# Patient Record
Sex: Male | Born: 1960
Health system: Southern US, Community
[De-identification: ages and names within clinical notes are randomized; demographics above are authoritative.]

## PROBLEM LIST (undated history)

## (undated) DIAGNOSIS — K219 Gastro-esophageal reflux disease without esophagitis: Secondary | ICD-10-CM

## (undated) DIAGNOSIS — E785 Hyperlipidemia, unspecified: Secondary | ICD-10-CM

## (undated) HISTORY — DX: Hyperlipidemia, unspecified: E78.5

## (undated) HISTORY — DX: Gastro-esophageal reflux disease without esophagitis: K21.9

---

## 1995-03-30 ENCOUNTER — Encounter: Payer: Self-pay | Admitting: Internal Medicine

## 2004-11-22 ENCOUNTER — Ambulatory Visit: Payer: Self-pay | Admitting: Internal Medicine

## 2004-12-06 ENCOUNTER — Ambulatory Visit: Payer: Self-pay | Admitting: Internal Medicine

## 2005-01-26 ENCOUNTER — Ambulatory Visit: Payer: Self-pay | Admitting: Internal Medicine

## 2005-02-02 ENCOUNTER — Ambulatory Visit: Payer: Self-pay | Admitting: Internal Medicine

## 2005-02-03 ENCOUNTER — Ambulatory Visit: Payer: Self-pay | Admitting: Gastroenterology

## 2005-03-09 ENCOUNTER — Ambulatory Visit: Payer: Self-pay | Admitting: Internal Medicine

## 2005-04-26 ENCOUNTER — Ambulatory Visit: Payer: Self-pay | Admitting: Internal Medicine

## 2005-08-26 ENCOUNTER — Encounter (INDEPENDENT_AMBULATORY_CARE_PROVIDER_SITE_OTHER): Payer: Self-pay | Admitting: *Deleted

## 2005-08-26 ENCOUNTER — Ambulatory Visit (HOSPITAL_BASED_OUTPATIENT_CLINIC_OR_DEPARTMENT_OTHER): Admission: RE | Admit: 2005-08-26 | Discharge: 2005-08-26 | Payer: Self-pay | Admitting: General Surgery

## 2005-12-22 ENCOUNTER — Ambulatory Visit: Payer: Self-pay | Admitting: Licensed Clinical Social Worker

## 2006-05-11 ENCOUNTER — Ambulatory Visit: Payer: Self-pay | Admitting: Internal Medicine

## 2006-09-25 ENCOUNTER — Ambulatory Visit: Payer: Self-pay | Admitting: Internal Medicine

## 2006-10-03 ENCOUNTER — Ambulatory Visit: Payer: Self-pay | Admitting: Internal Medicine

## 2006-11-07 ENCOUNTER — Ambulatory Visit: Payer: Self-pay | Admitting: Gastroenterology

## 2007-03-14 ENCOUNTER — Ambulatory Visit: Payer: Self-pay | Admitting: Internal Medicine

## 2007-03-14 LAB — CONVERTED CEMR LAB
ALT: 35 units/L (ref 0–40)
AST: 27 units/L (ref 0–37)
Albumin: 4.2 g/dL (ref 3.5–5.2)
Alkaline Phosphatase: 89 units/L (ref 39–117)
Bilirubin, Direct: 0.1 mg/dL (ref 0.0–0.3)
Cholesterol: 269 mg/dL (ref 0–200)
Direct LDL: 209.4 mg/dL
HDL: 43.1 mg/dL (ref >39.0)
Total Bilirubin: 0.8 mg/dL (ref 0.3–1.2)
Total CHOL/HDL Ratio: 6.2
Total Protein: 6.8 g/dL (ref 6.0–8.3)
Triglycerides: 112 mg/dL (ref 0–149)
VLDL: 22 mg/dL (ref 0–40)

## 2007-03-21 ENCOUNTER — Ambulatory Visit: Payer: Self-pay | Admitting: Internal Medicine

## 2007-07-06 ENCOUNTER — Ambulatory Visit: Payer: Self-pay | Admitting: Internal Medicine

## 2007-07-06 ENCOUNTER — Telehealth (INDEPENDENT_AMBULATORY_CARE_PROVIDER_SITE_OTHER): Payer: Self-pay | Admitting: *Deleted

## 2007-07-06 LAB — CONVERTED CEMR LAB
Cholesterol: 190 mg/dL (ref 0–200)
HDL: 33.6 mg/dL — ABNORMAL LOW (ref >39.0)
LDL Cholesterol: 134 mg/dL — ABNORMAL HIGH (ref 0–99)
Total CHOL/HDL Ratio: 5.7
Triglycerides: 112 mg/dL (ref 0–149)
VLDL: 22 mg/dL (ref 0–40)

## 2007-07-11 ENCOUNTER — Telehealth: Payer: Self-pay | Admitting: Internal Medicine

## 2007-07-11 ENCOUNTER — Telehealth (INDEPENDENT_AMBULATORY_CARE_PROVIDER_SITE_OTHER): Payer: Self-pay | Admitting: *Deleted

## 2007-07-24 DIAGNOSIS — E785 Hyperlipidemia, unspecified: Secondary | ICD-10-CM | POA: Insufficient documentation

## 2008-06-23 ENCOUNTER — Telehealth: Payer: Self-pay | Admitting: Internal Medicine

## 2008-10-17 ENCOUNTER — Telehealth: Payer: Self-pay | Admitting: Internal Medicine

## 2008-11-07 ENCOUNTER — Telehealth: Payer: Self-pay | Admitting: Internal Medicine

## 2008-11-11 ENCOUNTER — Telehealth: Payer: Self-pay | Admitting: Internal Medicine

## 2008-11-21 ENCOUNTER — Ambulatory Visit: Payer: Self-pay | Admitting: Internal Medicine

## 2008-11-21 LAB — CONVERTED CEMR LAB
ALT: 28 units/L (ref 0–53)
AST: 21 units/L (ref 0–37)
Albumin: 4.4 g/dL (ref 3.5–5.2)
Alkaline Phosphatase: 74 units/L (ref 39–117)
Bilirubin, Direct: 0.1 mg/dL (ref 0.0–0.3)
Cholesterol: 128 mg/dL (ref 0–200)
HDL: 31.2 mg/dL — ABNORMAL LOW (ref >39.0)
LDL Cholesterol: 84 mg/dL (ref 0–99)
Total Bilirubin: 0.8 mg/dL (ref 0.3–1.2)
Total CHOL/HDL Ratio: 4.1
Total Protein: 7.2 g/dL (ref 6.0–8.3)
Triglycerides: 66 mg/dL (ref 0–149)
VLDL: 13 mg/dL (ref 0–40)

## 2008-11-27 ENCOUNTER — Ambulatory Visit: Payer: Self-pay | Admitting: Internal Medicine

## 2008-11-27 DIAGNOSIS — K219 Gastro-esophageal reflux disease without esophagitis: Secondary | ICD-10-CM | POA: Insufficient documentation

## 2008-12-29 ENCOUNTER — Ambulatory Visit: Payer: Self-pay | Admitting: Internal Medicine

## 2009-02-13 ENCOUNTER — Encounter: Payer: Self-pay | Admitting: Internal Medicine

## 2009-03-11 ENCOUNTER — Ambulatory Visit: Payer: Self-pay | Admitting: Internal Medicine

## 2009-03-11 LAB — CONVERTED CEMR LAB
ALT: 20 units/L (ref 0–53)
AST: 21 units/L (ref 0–37)
Albumin: 4.3 g/dL (ref 3.5–5.2)
Alkaline Phosphatase: 62 units/L (ref 39–117)
Bilirubin, Direct: 0 mg/dL (ref 0.0–0.3)
Cholesterol: 178 mg/dL (ref 0–200)
HDL: 43.8 mg/dL (ref >39.00)
LDL Cholesterol: 111 mg/dL — ABNORMAL HIGH (ref 0–99)
Total Bilirubin: 0.8 mg/dL (ref 0.3–1.2)
Total CHOL/HDL Ratio: 4
Total Protein: 7.4 g/dL (ref 6.0–8.3)
Triglycerides: 114 mg/dL (ref 0.0–149.0)
VLDL: 22.8 mg/dL (ref 0.0–40.0)

## 2009-03-17 ENCOUNTER — Ambulatory Visit: Payer: Self-pay | Admitting: Internal Medicine

## 2009-09-21 ENCOUNTER — Telehealth: Payer: Self-pay | Admitting: Internal Medicine

## 2009-09-30 ENCOUNTER — Ambulatory Visit: Payer: Self-pay | Admitting: Internal Medicine

## 2009-09-30 LAB — CONVERTED CEMR LAB
Cholesterol: 171 mg/dL (ref 0–200)
HDL: 37.6 mg/dL — ABNORMAL LOW (ref >39.00)
LDL Cholesterol: 114 mg/dL — ABNORMAL HIGH (ref 0–99)
Total CHOL/HDL Ratio: 5
Triglycerides: 97 mg/dL (ref 0.0–149.0)
VLDL: 19.4 mg/dL (ref 0.0–40.0)

## 2009-10-06 ENCOUNTER — Telehealth: Payer: Self-pay | Admitting: Internal Medicine

## 2009-11-06 ENCOUNTER — Encounter (INDEPENDENT_AMBULATORY_CARE_PROVIDER_SITE_OTHER): Payer: Self-pay | Admitting: *Deleted

## 2009-11-09 ENCOUNTER — Telehealth: Payer: Self-pay | Admitting: Internal Medicine

## 2010-03-12 ENCOUNTER — Telehealth: Payer: Self-pay | Admitting: Internal Medicine

## 2010-03-15 ENCOUNTER — Ambulatory Visit: Payer: Self-pay | Admitting: Family Medicine

## 2010-03-16 ENCOUNTER — Telehealth: Payer: Self-pay | Admitting: Internal Medicine

## 2010-03-18 ENCOUNTER — Encounter: Payer: Self-pay | Admitting: Family Medicine

## 2010-03-18 LAB — HM COLONOSCOPY

## 2010-04-13 ENCOUNTER — Ambulatory Visit: Payer: Self-pay | Admitting: Internal Medicine

## 2010-04-13 LAB — CONVERTED CEMR LAB
ALT: 24 units/L (ref 0–53)
AST: 22 units/L (ref 0–37)
Albumin: 4.5 g/dL (ref 3.5–5.2)
Alkaline Phosphatase: 70 units/L (ref 39–117)
BUN: 15 mg/dL (ref 6–23)
Basophils Absolute: 0.1 10*3/uL (ref 0.0–0.1)
Basophils Relative: 1 % (ref 0.0–3.0)
Bilirubin Urine: NEGATIVE
Bilirubin, Direct: 0.1 mg/dL (ref 0.0–0.3)
CO2: 29 meq/L (ref 19–32)
Calcium: 9.9 mg/dL (ref 8.4–10.5)
Chloride: 101 meq/L (ref 96–112)
Cholesterol: 216 mg/dL — ABNORMAL HIGH (ref 0–200)
Creatinine, Ser: 1.1 mg/dL (ref 0.4–1.5)
Direct LDL: 146.1 mg/dL
Eosinophils Absolute: 0.3 10*3/uL (ref 0.0–0.7)
Eosinophils Relative: 5.5 % — ABNORMAL HIGH (ref 0.0–5.0)
GFR calc non Af Amer: 75.21 mL/min (ref >60)
Glucose, Bld: 96 mg/dL (ref 70–99)
HCT: 43.1 % (ref 39.0–52.0)
HDL: 47.2 mg/dL (ref >39.00)
Hemoglobin: 14.9 g/dL (ref 13.0–17.0)
Ketones, ur: NEGATIVE mg/dL
Leukocytes, UA: NEGATIVE
Lymphocytes Relative: 22.2 % (ref 12.0–46.0)
Lymphs Abs: 1.3 10*3/uL (ref 0.7–4.0)
MCHC: 34.5 g/dL (ref 30.0–36.0)
MCV: 95.3 fL (ref 78.0–100.0)
Monocytes Absolute: 0.7 10*3/uL (ref 0.1–1.0)
Monocytes Relative: 11.1 % (ref 3.0–12.0)
Neutro Abs: 3.7 10*3/uL (ref 1.4–7.7)
Neutrophils Relative %: 60.2 % (ref 43.0–77.0)
Nitrite: NEGATIVE
PSA: 0.31 ng/mL (ref 0.10–4.00)
Platelets: 299 10*3/uL (ref 150.0–400.0)
Potassium: 4.5 meq/L (ref 3.5–5.1)
RBC: 4.52 M/uL (ref 4.22–5.81)
RDW: 13.4 % (ref 11.5–14.6)
Sodium: 139 meq/L (ref 135–145)
Specific Gravity, Urine: 1.01 (ref 1.000–1.030)
TSH: 1.29 microintl units/mL (ref 0.35–5.50)
Total Bilirubin: 0.9 mg/dL (ref 0.3–1.2)
Total CHOL/HDL Ratio: 5
Total Protein, Urine: NEGATIVE mg/dL
Total Protein: 7.2 g/dL (ref 6.0–8.3)
Triglycerides: 114 mg/dL (ref 0.0–149.0)
Urine Glucose: NEGATIVE mg/dL
Urobilinogen, UA: 0.2 (ref 0.0–1.0)
VLDL: 22.8 mg/dL (ref 0.0–40.0)
WBC: 6.1 10*3/uL (ref 4.5–10.5)
pH: 7 (ref 5.0–8.0)

## 2010-04-20 ENCOUNTER — Telehealth: Payer: Self-pay | Admitting: Internal Medicine

## 2010-05-11 ENCOUNTER — Ambulatory Visit: Payer: Self-pay | Admitting: Internal Medicine

## 2010-10-27 ENCOUNTER — Telehealth: Payer: Self-pay | Admitting: Internal Medicine

## 2011-01-18 NOTE — Assessment & Plan Note (Signed)
Summary: ear discomfort//ccm   Vital Signs:  Patient profile:   50 year old male Weight:      189 pounds BMI:     26.83 Temp:     98.1 degrees F oral BP sitting:   118 / 84  (left arm) Cuff size:   regular  Vitals Entered By: Raechel Ache, RN (March 17, 2009 8:47 AM)  CC:  C/o L ear discomfort.Marland Kitchen  History of Present Illness: 50 year old patient who presents with a two day history of fullness and diminished hearing in the left ear.  There have been no URI symptoms or ear discomfort.  He was somewhat concerned because he will be flying internationally in about one week.  No fever. He has treated gastroesophageal reflux disease, and dyslipidemia, which have been stable  Dyspepsia History:      There is a prior history of GERD.     Allergies: No Known Drug Allergies  Past History:  Past Medical History:    Hyperlipidemia    GERD     (11/27/2008)  Physical Exam  General:  Well-developed,well-nourished,in no acute distress; alert,appropriate and cooperative throughout examination Head:  Normocephalic and atraumatic without obvious abnormalities. No apparent alopecia or balding. Ears:  R ear normal and L ear normal.  Weber did not lateralize   Impression & Recommendations:  Problem # 1:  GERD (ICD-530.81)  His updated medication list for this problem includes:    Nexium 40 Mg Cpdr (Esomeprazole magnesium) ..... One by mouth daily  Problem # 2:  OTITIS MEDIA, SEROUS, ACUTE, ALLERGIC (ICD-381.04)  Complete Medication List: 1)  Simvastatin 20 Mg Tabs (Simvastatin) .Marland Kitchen.. 1 by mouth at bedtime 2)  Nexium 40 Mg Cpdr (Esomeprazole magnesium) .... One by mouth daily  Patient Instructions: 1)  Avoid foods high in acid (tomatoes, citrus juices, spicy foods). Avoid eating within two hours of lying down or before exercising. Do not over eat; try smaller more frequent meals. Elevate head of bed twelve inches when sleeping. 2)  Claritin-D 24 hour-one every a.m. 3)  Nasacort AQ use  daily

## 2011-01-18 NOTE — Progress Notes (Signed)
Summary: pt has question  Phone Note Call from Patient Call back at [phone] press 0 to have pt paged   Caller: Patient Summary of Call: Pt called to get result from cholesterol screening. Please call asap today.  Initial call taken by: Lucy Antigua,  October 06, 2009 9:25 AM  Follow-up for Phone Call        Pt given results over the phone.  He is concerned that not as good as before and simvastatin was changed from 40mg  to 20mg  11/27/08.  Should he bump it back up or ok as is? Follow-up by: Gladis Riffle, RN,  October 06, 2009 2:28 PM  Additional Follow-up for Phone Call Additional follow up Details #1::        lipid panel is perfectly adequate---i would stay on same dose Additional Follow-up by: Birdie Sons MD,  October 06, 2009 4:20 PM    Additional Follow-up for Phone Call Additional follow up Details #2::    Patient notified.  Follow-up by: Gladis Riffle, RN,  October 07, 2009 2:20 PM

## 2011-01-18 NOTE — Letter (Signed)
Summary: Physical Examination  Physical Examination   Imported By: Maryln Gottron 03/17/2009 15:16:14  _____________________________________________________________________  External Attachment:    Type:   Image     Comment:   External Document

## 2011-01-18 NOTE — Progress Notes (Signed)
Summary: med request  Phone Note Call from Patient Call back at cell [phone]   Caller: Patient--live Call For: Kyle Palmer Summary of Call: Pt seen by Dr Caryl Never yesterday for throat issues.  States needs something to calm his nerves as he has worked himself up to point of losing weight and not sleeping.  Cannot have endoscopy until Thursday.  [facility] [address] Initial call taken by: Gladis Riffle, RN,  March 16, 2010 8:21 AM  Follow-up for Phone Call        alprazolam 0.25 mg by mouth once daily as needed #5/0 refills Follow-up by: Kyle Palmer,  March 16, 2010 9:51 AM  Additional Follow-up for Phone Call Additional follow up Details #1::        see Rx.  Patient notified. Pt advised only as needed and only #5 tabs. Additional Follow-up by: Gladis Riffle, RN,  March 16, 2010 11:02 AM    New/Updated Medications: ALPRAZOLAM 0.25 MG TABS (ALPRAZOLAM) Take 1 tablet by mouth once a day as needed Prescriptions: ALPRAZOLAM 0.25 MG TABS (ALPRAZOLAM) Take 1 tablet by mouth once a day as needed  #5 x 0   Entered by:   Gladis Riffle, RN   Authorized by:   Kyle Palmer   Signed by:   Gladis Riffle, RN on 03/16/2010   Method used:   Telephoned to ...       [id number]       [address]       [place], [place]  [address]       Ph: [phone] or [phone]       Fax: [phone]   RxID:   [id number]

## 2011-01-18 NOTE — Progress Notes (Signed)
Summary: pt said Dr. Penni Bombard is sending some info to Dr. Cato Mulligan  Phone Note Call from Patient Call back at Work Phone [phone] Call back at Press 0 to have pt paged   Caller: Patient Summary of Call: Pt said that Dr. Penni Bombard with [facility] is going to be send Dr. Cato Mulligan a copy of pts xray, showing calcium deposits built up on patients aorta. Pt just wants to make Dr. Cato Mulligan aware.  Initial call taken by: Lucy Antigua,  November 09, 2009 8:22 AM  Follow-up for Phone Call        get information Follow-up by: Birdie Sons MD,  November 09, 2009 12:28 PM  Additional Follow-up for Phone Call Additional follow up Details #1::        Dr Penni Bombard office closed for holiday weekend.Gladis Riffle, RN  November 11, 2009 4:15 PM   [facility] is [phone]  Report is  in receipt.  will give to dr swords. Additional Follow-up by: Gladis Riffle, RN,  November 17, 2009 2:30 PM

## 2011-01-18 NOTE — Progress Notes (Signed)
Summary: Physical blood work review  Phone Note Call from Patient Call back at Work Phone [phone]   Reason for Call: Talk to Nurse Summary of Call: Patient wants to review blood work.  Was scheduled for a physical today but was bumped from schedule. Please call and go over blood work - physical rescheduled for 5/24.  Call at [phone] (press 0 and have him paged).  Thank you Initial call taken by: Everrett Coombe,  Apr 20, 2010 8:18 AM  Follow-up for Phone Call        explained blood work on personal voice mail.Joya Gaskins MD would review at cpx and he could have a copy then. Follow-up by: Gladis Riffle, RN,  Apr 20, 2010 9:02 AM

## 2011-01-18 NOTE — Progress Notes (Signed)
Summary: 2 Nexium a day???  Phone Note Call from Patient   Caller: Patient Call For: Dr Cato Mulligan Summary of Call: Pt is leaving on vacation tomorrow, and wants to know if he can take 2 Nexium 40 mg. instead of one.  He does not eat as healthy while on vacation. [phone] [facility]) Initial call taken by: Lynann Beaver CMA,  June 23, 2008 1:18 PM  Follow-up for Phone Call        it is not correct to double medicaiton for diet noncompliance. EAT correctly even on vacation!   The medication cannot be refilled early if he doubles it! Follow-up by: Stacie Glaze MD,  June 23, 2008 3:05 PM  Additional Follow-up for Phone Call Additional follow up Details #1::        Message left for pt with Dr. Lovell Sheehan' recommendations. Additional Follow-up by: Lynann Beaver CMA,  June 23, 2008 3:15 PM

## 2011-01-18 NOTE — Progress Notes (Signed)
Summary: REQ FOR INFO / RETURN CALL  Phone Note Call from Patient   Caller: Patient  [phone] Reason for Call: Talk to Nurse Summary of Call: Pt called to speak with RN (w/ a pt).Marland KitchenMarland KitchenPt adv that he has a question about lab results, currently applying for life insurance policy - needs info.... Pt can be reached at [phone]., press 0 and have pt paged..... If unable to make contact at this work # then pt can be reached at [phone].  Initial call taken by: Debbra Riding,  March 12, 2010 10:07 AM  Follow-up for Phone Call        spoke with patient  Follow-up by: Kern Reap CMA Duncan Dull),  March 12, 2010 2:39 PM

## 2011-01-18 NOTE — Progress Notes (Signed)
Summary:  Simvastatin refill  Phone Note Call from Patient Call back at [phone] (cell)   Caller: Patient Call For: Birdie Sons MD Summary of Call: Pt Simvastatin was denied.  Pt is out, requesting enough to make it to his OV on 12/4 with Dr Cato Mulligan, that would be 10 pills please. Call pt at [phone] if this is a problem only, otherwise he will pick-up at his pharmacy. Initial call taken by: Sid Falcon LPN,  November 11, 2008 8:54 AM  Follow-up for Phone Call        pt made appt for 11/27/08 for rov and cpx for 1/10.  Rx done electronically  and pt aware. Follow-up by: Gladis Riffle, RN,  November 11, 2008 10:12 AM    New/Updated Medications: SIMVASTATIN 40 MG TABS (SIMVASTATIN) 1 once daily   Prescriptions: SIMVASTATIN 40 MG TABS (SIMVASTATIN) 1 once daily  #30 x 0   Entered by:   Gladis Riffle, RN   Authorized by:   Birdie Sons MD   Signed by:   Gladis Riffle, RN on 11/11/2008   Method used:   [facility]        [facility] [address]  [id number]       [address]       [place], [place]  [address]       Ph: [phone] or [phone]       Fax: [phone]   RxID:   [id number]

## 2011-01-18 NOTE — Progress Notes (Signed)
Summary: refill-simvastatin  Phone Note Call from Patient Call back at Home Phone [phone]   Caller: pt live Call For: Swords Summary of Call: Crestor generic 40 mg is out of pills and has not been in for a while.  He has an appt after thanksgiving  [facility] [address]  Initial call taken by: Roselle Locus,  November 07, 2008 3:25 PM  Follow-up for Phone Call        is he on Crestor or simvastatin? Follow-up by: Birdie Sons MD,  November 08, 2008 1:54 PM  Additional Follow-up for Phone Call Additional follow up Details #1::        Simvastin is what he on, and it says in the chart "absolutely" no more refills. Additional Follow-up by: Lynann Beaver CMA,  November 10, 2008 8:07 AM    Additional Follow-up for Phone Call Additional follow up Details #2::    appt he has is 12/4 for lab.  He needs appt with MD one week later so Rx can be filled.  Last seen 03/21/07.Left message on machine. Pt to call back to explain this and make appt for 12/11 Follow-up by: Gladis Riffle, RN,  November 10, 2008 1:03 PM  Additional Follow-up for Phone Call Additional follow up Details #3:: Details for Additional Follow-up Action Taken: Appt made for rov 11/27/08.  also made cpx appt for jan, 2010.  Rx will be called in. Additional Follow-up by: Gladis Riffle, RN,  November 11, 2008 9:53 AM

## 2011-01-18 NOTE — Progress Notes (Signed)
Summary: Pt called re: Nexium. Pt was in for ov in May 2011  Phone Note Call from Patient Call back at Work Phone [phone] Call back at press 0 and have pt paged or call on cell [phone]   Caller: Patient Summary of Call: Pt called and said that he is irritated that his Nexium can not be called in unless he makes and appt. Pt wants to know why, since he was here in May of 2011. [facility] [address]  Initial call taken by: Lucy Antigua,  October 27, 2010 1:24 PM  Follow-up for Phone Call        Phone Call Completed, Rx Called In Follow-up by: Alfred Levins, CMA,  October 27, 2010 1:45 PM    Prescriptions: NEXIUM 40 MG  CPDR (ESOMEPRAZOLE MAGNESIUM) one by mouth twice daily  #180 x 0   Entered by:   Alfred Levins, CMA   Authorized by:   Birdie Sons MD   Signed by:   Alfred Levins, CMA on 10/27/2010   Method used:   Electronically to        [facility]       [address]       [place], [place]  [address]       Ph: [phone] or [phone]       Fax: [phone]   RxID:   [id number] NEXIUM 40 MG  CPDR (ESOMEPRAZOLE MAGNESIUM) one by mouth twice daily  #180 x 0   Entered by:   Alfred Levins, CMA   Authorized by:   Nelwyn Salisbury MD   Signed by:   Alfred Levins, CMA on 10/27/2010   Method used:   Electronically to        [facility]       [address]       [place], [place]  [address]       Ph: [phone] or [phone]       Fax: [phone]   RxID:   [id number]

## 2011-01-18 NOTE — Progress Notes (Signed)
Summary: nexium RF-pull chart to see if phone note complete  Phone Note Call from Patient   Summary of Call: Here to get lab.  States he needs Nexium called in.  RF have expired. [id number] Initial call taken by: Rudy Jew, RN,  July 06, 2007 8:55 AM  Follow-up for Phone Call        ok x1 per Dr Lovell Sheehan.  Pharm notified and pt advised Follow-up by: Rudy Jew, RN,  July 10, 2007 10:24 AM

## 2011-01-18 NOTE — Progress Notes (Signed)
Summary: lab results  Phone Note Call from Patient   Caller: Patient Call For: Dr. Cato Mulligan Reason for Call: Lab or Test Results Details for Reason: wants chol results fromj 07/06/2007 Initial call taken by: Lynann Beaver CMA,  July 11, 2007 12:26 PM  Follow-up for Phone Call        lipids are o.k. hdl is low---most of this is genentic (most likely). Low fat diet, aerobic exercise will likely help Follow-up by: Birdie Sons MD,  July 11, 2007 2:13 PM  Additional Follow-up for Phone Call Additional follow up Details #1::        Kristen was taking 20 mg of Crestor, so he will go back on 40 mg. Additional Follow-up by: Lynann Beaver CMA,  July 11, 2007 2:48 PM    Additional Follow-up for Phone Call Additional follow up Details #2::    ok to resume meds

## 2011-01-18 NOTE — Progress Notes (Signed)
  Medications Added SIMVASTATIN 40 MG TABS (SIMVASTATIN)             New/Updated Medications: SIMVASTATIN 40 MG TABS (SIMVASTATIN)

## 2011-01-18 NOTE — Miscellaneous (Signed)
Summary: Immunization Entry-tdap   Tetanus/Td Immunization History:    Tetanus/Td # 1:  Tdap (11/03/2006)

## 2011-01-18 NOTE — Procedures (Signed)
Summary: Colonoscopic snare polypectomy/Dr. Sharrell Ku  Colonoscopic snare polypectomy/Dr. Sharrell Ku   Imported By: Maryln Gottron 04/12/2010 10:14:08  _____________________________________________________________________  External Attachment:    Type:   Image     Comment:   External Document

## 2011-01-18 NOTE — Assessment & Plan Note (Signed)
Summary: THROAT IRRITATION GETTING WORSE//SLM   Vital Signs:  Patient profile:   50 year old male Weight:      181 pounds Temp:     98.2 degrees F oral BP sitting:   130 / 84  (left arm) Cuff size:   regular  Vitals Entered By: Sid Falcon LPN (March 15, 2010 1:43 PM) CC: Throat concerns   History of Present Illness: Several months of mild "throat" irritation.  Occ mild discomfort with swallowing.  No true dysphagia.  Denies any appetite changes.  Hx GERD and takes Nexium regularly.  Pt is concerned that he may have esophageal cancer.  No neck masses.   Ex-smoker (quit age 21).  ETOH about 2 glasses wine/day.  No oral tobacco use.  No chronic hoarseness.  Weight is down slightly from last year of ? significance.  Pos FH colon cancer in pt's mother.  Pt never screened.  No change in stools.  Allergies (verified): No Known Drug Allergies  Past History:  Past Medical History: Last updated: 11/27/2008 Hyperlipidemia GERD  Family History: Last updated: 03/11/2009 Non contributory  Social History: Last updated: 03/11/2009 Former Smoker Married Regular exercise-no  Risk Factors: Exercise: no (03/11/2009)  Risk Factors: Smoking Status: quit (07/24/2007) PMH-FH-SH reviewed for relevance  Review of Systems  The patient denies anorexia, fever, weight gain, hoarseness, chest pain, syncope, dyspnea on exertion, abdominal pain, melena, hematochezia, and severe indigestion/heartburn.    Physical Exam  General:  Well-developed,well-nourished,in no acute distress; alert,appropriate and cooperative throughout examination Mouth:  Oral mucosa and oropharynx without lesions or exudates.  Teeth in good repair. Neck:  No deformities, masses, or tenderness noted. Lungs:  Normal respiratory effort, chest expands symmetrically. Lungs are clear to auscultation, no crackles or wheezes. Heart:  Normal rate and regular rhythm. S1 and S2 normal without gallop, murmur, click, rub or other  extra sounds. Abdomen:  soft and non-tender.   Cervical Nodes:  No lymphadenopathy noted   Impression & Recommendations:  Problem # 1:  NECK PAIN (ICD-723.1) Assessment New pt has extreme anxiety regarding esoph cancer.  Does have some mild weight loss since last year of ?significance.  Rec GI referral esp with pos FH colon cancer and no prior screening.  Pt requests Dr Kinnie Scales.  Problem # 2:  FM HX MALIGNANT NEOPLASM GASTROINTESTINAL TRACT (ICD-V16.0) needs colonoscopy screening. Orders: Gastroenterology Referral (GI)  Complete Medication List: 1)  Simvastatin 20 Mg Tabs (Simvastatin) .Marland Kitchen.. 1 by mouth at bedtime--needs office visit in march 2)  Nexium 40 Mg Cpdr (Esomeprazole magnesium) .... One by mouth daily  Patient Instructions: 1)  We will call you regarding Appt with Dr Kinnie Scales.

## 2011-01-18 NOTE — Progress Notes (Signed)
Summary: RX REFILL  Phone Note Call from Patient Call back at [phone]   Caller: pt live Call For: Swords Reason for Call: Refill Medication Summary of Call: PATIENT NEEDS RX REFILL FOR GENERIC OF ZOCOR 40MG .  HE CALLED IS PHARMACY AND THEY SAID THE dR NEEDED TO OK IT.  [facility] [address]  PATIENT IS OUT OF MED. Initial call taken by: Celine Ahr,  October 17, 2008 11:17 AM    New/Updated Medications: SIMVASTATIN 40 MG TABS (SIMVASTATIN) 1 once daily- ABSOLUTELY NO MORE WITHOUT OV!!!!!!!   Prescriptions: SIMVASTATIN 40 MG TABS (SIMVASTATIN) 1 once daily- ABSOLUTELY NO MORE WITHOUT OV!!!!!!!  #15 x 0   Entered by:   Willy Eddy, LPN   Authorized by:   Birdie Sons MD   Signed by:   Willy Eddy, LPN on 13/07/6577   Method used:   [facility]        [facility] [address]  [facility]       [address]       [place], [place]  [address]       Ph: [phone] or [phone]       Fax: [phone]   RxID:   [id number]

## 2011-01-18 NOTE — Assessment & Plan Note (Signed)
Summary: med refill, review labs/et   Vital Signs:  Patient Profile:   50 Years Old Male Weight:      190 pounds BP sitting:   110 / 72  Vitals Entered By: Lynann Beaver CMA (November 27, 2008 8:50 AM)                 Chief Complaint:  discuss lab results.  History of Present Illness:  Follow-Up Visit: not seen in > 1 year he has been taking meds without dificulty      This is a 50 year old man who presents for Follow-up visit.  The patient denies chest pain, palpitations, dizziness, syncope, low blood sugar symptoms, high blood sugar symptoms, edema, SOB, DOE, PND, and orthopnea.  Since the last visit the patient notes no new problems or concerns.  The patient reports taking meds as prescribed.  When questioned about possible medication side effects, the patient notes none.    Past Medical History: Hyperlipidemia GERD  Past Surgical History:  Social History: Former Smoker  Family History:  no other complaints in a complete ROS     Past Medical History:    Hyperlipidemia    GERD      Physical Exam  General:     Well-developed,well-nourished,in no acute distress; alert,appropriate and cooperative throughout examination Head:     normocephalic and atraumatic.   Eyes:     pupils equal and pupils round.   Ears:     R ear normal and L ear normal.      Impression & Recommendations:  Problem # 1:  HYPERLIPIDEMIA (ICD-272.4) continue meds His updated medication list for this problem includes:    Simvastatin 20 Mg Tabs (Simvastatin) .Marland Kitchen... 1 by mouth at bedtime  Labs Reviewed: Chol: 128 (11/21/2008)   HDL: 31.2 (11/21/2008)   LDL: 84 (11/21/2008)   TG: 66 (11/21/2008) SGOT: 21 (11/21/2008)   SGPT: 28 (11/21/2008)   Complete Medication List: 1)  Simvastatin 20 Mg Tabs (Simvastatin) .Marland Kitchen.. 1 by mouth at bedtime 2)  Nexium 40 Mg Cpdr (Esomeprazole magnesium) .... One by mouth daily   Patient Instructions: 1)  Please schedule a follow-up appointment in 6  months. CPX 2)  cancel January appt 3)  lipids 272.4 4)  liver 995.2   Prescriptions: SIMVASTATIN 20 MG TABS (SIMVASTATIN) 1 by mouth at bedtime  #90 x 3   Entered and Authorized by:   Birdie Sons MD   Signed by:   Birdie Sons MD on 11/27/2008   Method used:   [facility]        [facility] [address]  [facility]       [address]       [place], [place]  [address]       Ph: [phone] or [phone]       Fax: [phone]   RxID:   [id number]  ]

## 2011-01-18 NOTE — Procedures (Signed)
Summary: [facility] with biopsy/Dr. Sharrell Ku  [facility] with biopsy/Dr. Sharrell Ku   Imported By: Maryln Gottron 04/12/2010 10:22:22  _____________________________________________________________________  External Attachment:    Type:   Image     Comment:   External Document

## 2011-01-18 NOTE — Assessment & Plan Note (Signed)
Summary: form fill out/mhf   Vital Signs:  Patient profile:   50 year old male Height:      70.5 inches Weight:      186 pounds BMI:     26.41 Pulse rate:   72 / minute Resp:     12 per minute BP sitting:   114 / 70  (left arm)  Vitals Entered By: Gladis Riffle, RN (March 11, 2009 8:40 AM)  History of Present Illness: comes in for stated reason of form completion the form requires complete physical exam  ("march of the living")  Preventive Screening-Counseling & Management     Does Patient Exercise: no  Current Problems (verified): 1)  Preventive Health Care  (ICD-V70.0) 2)  Gerd  (ICD-530.81) 3)  Hyperlipidemia  (ICD-272.4)  Medications Prior to Update: 1)  Simvastatin 20 Mg Tabs (Simvastatin) .Marland Kitchen.. 1 By Mouth At Bedtime 2)  Nexium 40 Mg  Cpdr (Esomeprazole Magnesium) .... One By Mouth Daily  Current Medications (verified): 1)  Simvastatin 20 Mg Tabs (Simvastatin) .Marland Kitchen.. 1 By Mouth At Bedtime 2)  Nexium 40 Mg  Cpdr (Esomeprazole Magnesium) .... One By Mouth Daily  Allergies (verified): No Known Drug Allergies  Comments:  Nurse/Medical Assistant: cpx The patient's medications and allergies were reviewed with the patient and were updated in the Medication and Allergy Lists. Gladis Riffle, RN (March 11, 2009 8:41 AM)  Family History:    Non contributory  Social History:    Former Smoker    Married    Regular exercise-no    Does Patient Exercise:  no  Review of Systems       no other complaints in a complete ROS   Physical Exam  General:  see normal exam---documented on form   Impression & Recommendations:  Problem # 1:  Preventive Health Care (ICD-V70.0) health maint UTD form completed Orders: Venipuncture ([address]) TLB-Lipid Panel (80061-LIPID) TLB-Hepatic/Liver Function Pnl (80076-HEPATIC)  Complete Medication List: 1)  Simvastatin 20 Mg Tabs (Simvastatin) .Marland Kitchen.. 1 by mouth at bedtime 2)  Nexium 40 Mg Cpdr (Esomeprazole magnesium) .... One by mouth  daily

## 2011-01-18 NOTE — Progress Notes (Signed)
Summary: Needs labs ordered for Life Insurance   Phone Note Call from Patient Call back at Work Phone [phone] Call back at [phone] (cell)   Caller: Patient Summary of Call: Pt would lke to have order for cpx and hiv labs getting insurance life insurance. Initial call taken by: Trixie Dredge,  March 12, 2010 9:48 AM  Follow-up for Phone Call        ok to schedule CPX with labs and HIV Follow-up by: Birdie Sons MD,  March 12, 2010 4:36 PM  Additional Follow-up for Phone Call Additional follow up Details #1::        Patient notified. He requests to call back and schedule CPX and labs at another time. Additional Follow-up by: Gladis Riffle, RN,  March 15, 2010 4:36 PM

## 2011-01-18 NOTE — Assessment & Plan Note (Signed)
Summary: CPX//CCM   Vital Signs:  Patient profile:   50 year old male Height:      70 inches Weight:      179 pounds BMI:     25.78 Pulse rate:   64 / minute Pulse rhythm:   regular Resp:     12 per minute BP sitting:   124 / 82  (left arm) Cuff size:   regular  Vitals Entered By: Gladis Riffle, RN (May 11, 2010 9:01 AM) CC: cpx, labs done Is Patient Diabetic? No   CC:  cpx and labs done.  History of Present Illness: cpx  Preventive Screening-Counseling & Management  Alcohol-Tobacco     Smoking Status: quit  Current Problems (verified): 1)  Preventive Health Care  (ICD-V70.0) 2)  Gerd  (ICD-530.81) 3)  Hyperlipidemia  (ICD-272.4)  Current Medications (verified): 1)  Simvastatin 20 Mg Tabs (Simvastatin) .Marland Kitchen.. 1 By Mouth At Bedtime 2)  Nexium 40 Mg  Cpdr (Esomeprazole Magnesium) .... 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   Head:  normocephalic and atraumatic.   Eyes:  pupils equal and pupils round.   Ears:  R ear normal and L ear normal.   Neck:  No deformities, masses, or tenderness noted. Chest Wall:  no deformities and no tenderness.   Lungs:  normal respiratory effort and no intercostal retractions.   Heart:  normal rate and regular rhythm.   Abdomen:  soft and non-tender.   Msk:  No deformity or scoliosis noted of thoracic or lumbar spine.   Pulses:  R radial normal and L radial normal.   Extremities:  No clubbing, cyanosis, edema, or deformity noted  Neurologic:  cranial nerves II-XII intact and gait normal.   Skin:  turgor normal and color normal.   Psych:  memory intact for recent and remote and good eye contact.     Impression & Recommendations:  Problem # 1:  PREVENTIVE HEALTH CARE (ICD-V70.0) health maint UTD previous sxs of neck pain  resolved  Problem # 2:  HYPERLIPIDEMIA (ICD-272.4) pt concerned with side effects of statin encouraged medication use.  His updated medication list for this problem includes:    Simvastatin 20 Mg Tabs (Simvastatin) .Marland Kitchen... 1 by mouth at bedtime  Problem # 3:  GERD (ICD-530.81) continue current medications  His updated medication list for this problem includes:    Nexium 40 Mg Cpdr (Esomeprazole magnesium) ..... One by mouth twice daily admits to underlying anxiety.  Complete Medication List: 1)  Simvastatin 20 Mg Tabs (Simvastatin) .Marland Kitchen.. 1 by mouth at bedtime 2)  Nexium 40 Mg Cpdr (Esomeprazole magnesium) .... One by mouth twice daily   Preventive Care Screening  Colonoscopy:    Date:  03/18/2010    Next Due:  03/2013    Results:  polyps

## 2011-01-18 NOTE — Progress Notes (Signed)
Summary: recheck lipid  Phone Note Call from Patient Call back at Home Phone [phone] Call back at [phone]   Caller: Patient Call For: Birdie Sons MD Summary of Call: pt would to have lipid recheck Initial call taken by: Heron Sabins,  September 21, 2009 9:04 AM  Follow-up for Phone Call        ok 272.4 Follow-up by: Birdie Sons MD,  September 21, 2009 12:23 PM  Additional Follow-up for Phone Call Additional follow up Details #1::        [facility] 09-23-2009 8.30a Additional Follow-up by: Heron Sabins,  September 21, 2009 4:10 PM

## 2011-01-18 NOTE — Assessment & Plan Note (Signed)
Summary: fever/njr   Vital Signs:  Patient Profile:   50 Years Old Male Temp:     103.2 degrees F Pulse rate:   96 / minute BP sitting:   112 / 68  (left arm)  Vitals Entered By: Gladis Riffle, RN (December 29, 2008 9:20 AM)                 Chief Complaint:  c/o cough since 12/26/08 and fever--c/o abdominal soreness due to coughing.  History of Present Illness: Friday night cough followed by fever cough has been present for 3 days feels poorly generally no known ill contacts cough nonproductive no travel flu immunizations - NONE  Past Medical History: Hyperlipidemia GERD  Past Surgical History:  Social History: Former Smoker  Family History:  no other complaints in a complete ROS     Prior Medication List:  SIMVASTATIN 20 MG TABS (SIMVASTATIN) 1 by mouth at bedtime NEXIUM 40 MG  CPDR (ESOMEPRAZOLE MAGNESIUM) one by mouth daily   Current Allergies (reviewed today): No known allergies       Physical Exam  General:     Well-developed,well-nourished,in no acute distress; alert,appropriate and cooperative throughout examination Head:     normocephalic and atraumatic.   Eyes:     pupils equal and pupils round.   Ears:     R ear normal and L ear normal.   Neck:     supple and full ROM.   Chest Wall:     no deformities and no tenderness.   Lungs:     Normal respiratory effort, chest expands symmetrically. Lungs are clear to auscultation, no crackles or wheezes. Heart:     Normal rate and regular rhythm. S1 and S2 normal without gallop, murmur, click, rub or other extra sounds. Abdomen:     Bowel sounds positive,abdomen soft and non-tender without masses, organomegaly or hernias noted.    Impression & Recommendations:  Problem # 1:  INFLUENZA, WITH RESPIRATORY SYMPTOMS (ICD-487.1) clinical dx treat aggressively see meds--side effects discussed His updated medication list for this problem includes:    Tamiflu 75 Mg Caps (Oseltamivir phosphate) .Marland Kitchen...  Take one capsule by mouth twice a day   Complete Medication List: 1)  Simvastatin 20 Mg Tabs (Simvastatin) .Marland Kitchen.. 1 by mouth at bedtime 2)  Nexium 40 Mg Cpdr (Esomeprazole magnesium) .... One by mouth daily 3)  Tamiflu 75 Mg Caps (Oseltamivir phosphate) .... Take one capsule by mouth twice a day 4)  Hydrocodone-homatropine 5-1.5 Mg/75ml Syrp (Hydrocodone-homatropine) .Marland Kitchen.. 1 tsp three times a day as needed    Prescriptions: HYDROCODONE-HOMATROPINE 5-1.5 MG/5ML SYRP (HYDROCODONE-HOMATROPINE) 1 tsp three times a day as needed  #240 cc x 0   Entered and Authorized by:   Birdie Sons MD   Signed by:   Birdie Sons MD on 12/29/2008   Method used:   Print then Give to Patient   RxID:   [id number] TAMIFLU 75 MG CAPS (OSELTAMIVIR PHOSPHATE) Take one capsule by mouth twice a day  #14 x 0   Entered and Authorized by:   Birdie Sons MD   Signed by:   Birdie Sons MD on 12/29/2008   Method used:   [facility]        [facility]  [address]  [id number]       [address]       [place], [place]  [address]       Ph: [phone] or [phone]  Fax: [phone]   RxID:   [id number]

## 2011-04-10 ENCOUNTER — Other Ambulatory Visit: Payer: Self-pay | Admitting: Internal Medicine

## 2011-04-11 ENCOUNTER — Other Ambulatory Visit: Payer: Self-pay | Admitting: *Deleted

## 2011-04-11 DIAGNOSIS — Z0000 Encounter for general adult medical examination without abnormal findings: Secondary | ICD-10-CM

## 2011-04-11 MED ORDER — SIMVASTATIN 20 MG PO TABS: 20.0000 mg | ORAL_TABLET | Freq: Every day | ORAL | Status: DC

## 2011-05-06 NOTE — Op Note (Signed)
NAME:  Kyle Palmer, YONO NO.:  [id number]   MEDICAL RECORD NO.:  [id number]          PATIENT TYPE:  AMB   LOCATION:  [facility]                          FACILITY:  [facility]   PHYSICIAN:  Anselm Pancoast. Weatherly, M.D.DATE OF BIRTH:  December 21, 1960   DATE OF PROCEDURE:  DATE OF DISCHARGE:                                 OPERATIVE REPORT   PREOPERATIVE DIAGNOSIS:  Lipoma, left flank, lateral chest about 4 cm in  greatest length.   POSTOPERATIVE DIAGNOSIS:  Lipoma of left flank, lateral chest about 4 cm in  greatest length.   PROCEDURE:  Excision of lipoma.   ANESTHESIA:  Local.   SURGEON:  Anselm Pancoast. Zachery Dakins, M.D.   INDICATIONS FOR PROCEDURE:  [name] is a 50 year old male who has  had a lipoma in this area for about four years, gradually increasing.  His  father has actually had lipomas, and I saw him a year ago.  Because of his  work commitments, he has not had time to get it removed.  His wife  encouraged him to get it done, and he is here today for the planned  procedure.  I had offered a little Valium.  He said he did not need it.   DESCRIPTION OF PROCEDURE:  He was a little bit anxious, but the area was  prepped with Betadine solution after draping him, laying him flat with kind  of flexed to accentuate the area.  I had marked the area around it with a  pen so that when the anesthetic agent was in, I could still feel or see the  margins.  Approximately 12 cc of 1% Xylocaine with adrenaline was used.   A small incision was made over it, and working posterior to medial, the area  was elevated after I separated it from the surrounding adipose tissue.  The  little vessel coming up from the undersurface between the ribs was sutured  with a 3-0 chromic, divided, and then the area completely excised.  It looks  like it may actually be two little lipomas immediately adjacent to one  another to get this kind of cigar-shaped area.  The area was closed in  layers, 3-0  chromic on the deeper layer and then 5-0 nylon simple sutures on  the skin.   I will see him in the office on Friday to remove the stitches on the skin  and place Steri-Strips.  It will be sent for permanent pathology exam.  Grossly, it is obviously benign, and the patient was relieved after the  procedure.           ______________________________  Anselm Pancoast. Zachery Dakins, M.D.     WJW/MEDQ  D:  08/26/2005  T:  08/26/2005  Job:  [id number]

## 2011-06-03 ENCOUNTER — Other Ambulatory Visit: Payer: Self-pay | Admitting: Internal Medicine

## 2011-06-03 ENCOUNTER — Other Ambulatory Visit: Payer: Self-pay

## 2011-06-03 DIAGNOSIS — Z0000 Encounter for general adult medical examination without abnormal findings: Secondary | ICD-10-CM

## 2011-06-09 ENCOUNTER — Encounter: Payer: Self-pay | Admitting: Internal Medicine

## 2011-06-10 ENCOUNTER — Encounter: Payer: Self-pay | Admitting: Internal Medicine

## 2011-07-26 ENCOUNTER — Other Ambulatory Visit: Payer: Self-pay | Admitting: Internal Medicine

## 2011-07-31 ENCOUNTER — Other Ambulatory Visit: Payer: Self-pay | Admitting: Internal Medicine

## 2011-08-03 ENCOUNTER — Other Ambulatory Visit: Payer: Self-pay | Admitting: *Deleted

## 2011-08-03 MED ORDER — SIMVASTATIN 20 MG PO TABS: 20.0000 mg | ORAL_TABLET | Freq: Every day | ORAL | Status: DC

## 2011-08-11 NOTE — Telephone Encounter (Signed)
[facility] Please call pt. He is out. Thanks.

## 2011-08-12 MED ORDER — ESOMEPRAZOLE MAGNESIUM 40 MG PO CPDR: 40.0000 mg | DELAYED_RELEASE_CAPSULE | Freq: Every day | ORAL | Status: DC

## 2011-10-03 ENCOUNTER — Encounter: Payer: Self-pay | Admitting: Internal Medicine

## 2011-10-25 ENCOUNTER — Other Ambulatory Visit: Payer: Self-pay

## 2011-11-24 ENCOUNTER — Other Ambulatory Visit: Payer: Self-pay

## 2011-12-03 ENCOUNTER — Other Ambulatory Visit: Payer: Self-pay | Admitting: Internal Medicine

## 2011-12-07 ENCOUNTER — Encounter: Payer: Self-pay | Admitting: Internal Medicine

## 2012-03-09 ENCOUNTER — Other Ambulatory Visit: Payer: Self-pay | Admitting: Internal Medicine

## 2012-03-15 ENCOUNTER — Ambulatory Visit (INDEPENDENT_AMBULATORY_CARE_PROVIDER_SITE_OTHER): Payer: BC Managed Care – PPO | Admitting: Internal Medicine

## 2012-03-15 ENCOUNTER — Encounter: Payer: Self-pay | Admitting: Internal Medicine

## 2012-03-15 VITALS — BP 100/64 | Temp 102.4°F | Wt 172.0 lb

## 2012-03-15 DIAGNOSIS — J069 Acute upper respiratory infection, unspecified: Secondary | ICD-10-CM

## 2012-03-15 MED ORDER — HYDROCODONE-HOMATROPINE 5-1.5 MG/5ML PO SYRP: 5.0000 mL | ORAL_SOLUTION | Freq: Four times a day (QID) | ORAL | Status: AC | PRN

## 2012-03-15 NOTE — Progress Notes (Signed)
  Subjective:    Patient ID: Kyle Palmer, male    DOB: 29-Aug-1961, 51 y.o.   MRN: [id number]  HPI  51 year old patient who presents with a three-day history of fever and nonproductive cough. Cough is quite refractory and interferes with sleep. He has had fever but no chills    Review of Systems  Constitutional: Positive for fever, diaphoresis, activity change, appetite change and fatigue. Negative for chills.  HENT: Negative for hearing loss, ear pain, congestion, sore throat, trouble swallowing, neck stiffness, dental problem, voice change and tinnitus.   Eyes: Negative for pain, discharge and visual disturbance.  Respiratory: Positive for cough. Negative for chest tightness, wheezing and stridor.   Cardiovascular: Negative for chest pain, palpitations and leg swelling.  Gastrointestinal: Negative for nausea, vomiting, abdominal pain, diarrhea, constipation, blood in stool and abdominal distention.  Genitourinary: Negative for urgency, hematuria, flank pain, discharge, difficulty urinating and genital sores.  Musculoskeletal: Negative for myalgias, back pain, joint swelling, arthralgias and gait problem.  Skin: Negative for rash.  Neurological: Negative for dizziness, syncope, speech difficulty, weakness, numbness and headaches.  Hematological: Negative for adenopathy. Does not bruise/bleed easily.  Psychiatric/Behavioral: Negative for behavioral problems and dysphoric mood. The patient is not nervous/anxious.        Objective:   Physical Exam  Constitutional: He is oriented to person, place, and time. He appears well-developed and well-nourished. No distress.  HENT:  Head: Normocephalic.  Right Ear: External ear normal.  Left Ear: External ear normal.  Eyes: Conjunctivae and EOM are normal. Pupils are equal, round, and reactive to light.  Neck: Normal range of motion.  Cardiovascular: Normal rate and normal heart sounds.   Pulmonary/Chest: Effort normal and breath sounds  normal. No respiratory distress. He has no wheezes. He has no rales.       Chest is clear O2 saturation 97% Pulse rate 92  Abdominal: Bowel sounds are normal.  Musculoskeletal: Normal range of motion. He exhibits no edema and no tenderness.  Neurological: He is alert and oriented to person, place, and time.  Psychiatric: He has a normal mood and affect. His behavior is normal.          Assessment & Plan:   Viral URI with fever and nonproductive cough. We'll treat temperature aggressively with Tylenol every 6 hours as needed. He is also given samples of Celebrex to take one 200 mg capsule twice daily his cough will be treated symptomatically. He'll force fluids and report any clinical worsening

## 2012-03-15 NOTE — Patient Instructions (Signed)
Get plenty of rest, Drink lots of  clear liquids, and use Tylenol  for fever and discomfort.    Celebrex one twice  Daily  Call or return to clinic prn if these symptoms worsen or fail to improve as anticipated.

## 2012-04-08 ENCOUNTER — Other Ambulatory Visit: Payer: Self-pay | Admitting: Internal Medicine

## 2012-04-14 ENCOUNTER — Other Ambulatory Visit: Payer: Self-pay | Admitting: Internal Medicine

## 2012-07-09 ENCOUNTER — Other Ambulatory Visit: Payer: Self-pay | Admitting: Internal Medicine

## 2012-09-03 ENCOUNTER — Other Ambulatory Visit: Payer: Self-pay | Admitting: Internal Medicine

## 2012-09-04 ENCOUNTER — Other Ambulatory Visit: Payer: Self-pay | Admitting: Internal Medicine

## 2012-09-04 MED ORDER — SIMVASTATIN 20 MG PO TABS: 20.0000 mg | ORAL_TABLET | Freq: Every day | ORAL | Status: DC

## 2012-09-04 NOTE — Telephone Encounter (Signed)
Pt needs refill on [facility] [address] [phone]. Pt has appt on 10-15-2012

## 2012-09-04 NOTE — Telephone Encounter (Signed)
rx sent in electronically 

## 2012-10-15 ENCOUNTER — Ambulatory Visit: Payer: BC Managed Care – PPO | Admitting: Internal Medicine

## 2012-10-27 ENCOUNTER — Other Ambulatory Visit: Payer: Self-pay | Admitting: Internal Medicine

## 2013-01-28 ENCOUNTER — Other Ambulatory Visit: Payer: Self-pay | Admitting: Internal Medicine

## 2013-01-29 ENCOUNTER — Other Ambulatory Visit: Payer: Self-pay | Admitting: Internal Medicine

## 2013-01-29 NOTE — Telephone Encounter (Signed)
Pt has appt on 02-25-2013

## 2013-02-25 ENCOUNTER — Ambulatory Visit: Payer: BC Managed Care – PPO | Admitting: Internal Medicine

## 2013-03-21 ENCOUNTER — Telehealth: Payer: Self-pay | Admitting: Internal Medicine

## 2013-03-21 NOTE — Telephone Encounter (Signed)
Pt  decided to go ahead and schedule a cpe and no note is needed.

## 2013-03-30 ENCOUNTER — Other Ambulatory Visit: Payer: Self-pay | Admitting: Internal Medicine

## 2013-04-09 ENCOUNTER — Telehealth: Payer: Self-pay | Admitting: Internal Medicine

## 2013-04-09 DIAGNOSIS — Z0000 Encounter for general adult medical examination without abnormal findings: Secondary | ICD-10-CM

## 2013-04-09 NOTE — Telephone Encounter (Signed)
Pt would like to go to [facility] for his cpx labs in June. Will you put in computer?  Thanks.

## 2013-04-09 NOTE — Telephone Encounter (Signed)
done

## 2013-04-10 ENCOUNTER — Other Ambulatory Visit: Payer: BC Managed Care – PPO

## 2013-05-11 ENCOUNTER — Other Ambulatory Visit: Payer: Self-pay | Admitting: Internal Medicine

## 2013-05-16 ENCOUNTER — Other Ambulatory Visit: Payer: Self-pay | Admitting: Internal Medicine

## 2013-05-16 MED ORDER — SIMVASTATIN 20 MG PO TABS: ORAL_TABLET | ORAL | Status: DC

## 2013-05-16 NOTE — Addendum Note (Signed)
Addended by: Alfred Levins D on: 05/16/2013 02:06 PM   Modules accepted: Orders

## 2013-05-16 NOTE — Telephone Encounter (Signed)
rx sent in electronically 

## 2013-05-16 NOTE — Telephone Encounter (Signed)
PT calling to inquire about his refill request. It was denied initially because he needed to make an appt. The pt has a CPX scheduled for 06/05/13, can he have his refill now? Please assist.

## 2013-06-05 ENCOUNTER — Encounter: Payer: BC Managed Care – PPO | Admitting: Internal Medicine

## 2013-07-07 ENCOUNTER — Other Ambulatory Visit: Payer: Self-pay | Admitting: Internal Medicine

## 2013-07-13 ENCOUNTER — Other Ambulatory Visit: Payer: Self-pay | Admitting: Internal Medicine

## 2013-09-04 ENCOUNTER — Other Ambulatory Visit: Payer: BC Managed Care – PPO

## 2013-09-11 ENCOUNTER — Encounter: Payer: BC Managed Care – PPO | Admitting: Internal Medicine

## 2013-09-19 ENCOUNTER — Encounter: Payer: Self-pay | Admitting: Internal Medicine

## 2013-11-10 ENCOUNTER — Other Ambulatory Visit: Payer: Self-pay | Admitting: Internal Medicine

## 2014-01-06 ENCOUNTER — Telehealth: Payer: Self-pay | Admitting: Internal Medicine

## 2014-01-06 NOTE — Telephone Encounter (Signed)
Pt needs refill simvastatin 20 mg call [facility] [address]. Pt has cpx sch for 04-07-14

## 2014-01-07 MED ORDER — SIMVASTATIN 20 MG PO TABS: ORAL_TABLET | ORAL | Status: DC

## 2014-01-07 NOTE — Telephone Encounter (Signed)
rx sent in electronically 

## 2014-03-26 ENCOUNTER — Telehealth: Payer: Self-pay | Admitting: Internal Medicine

## 2014-03-26 DIAGNOSIS — Z0000 Encounter for general adult medical examination without abnormal findings: Secondary | ICD-10-CM

## 2014-03-26 NOTE — Telephone Encounter (Signed)
Pt wants to come to [place] for labs, please order labs at [facility], not [place] [facility]. Thanks.

## 2014-03-26 NOTE — Telephone Encounter (Signed)
Future order placed 

## 2014-03-31 ENCOUNTER — Other Ambulatory Visit: Payer: BC Managed Care – PPO

## 2014-04-03 ENCOUNTER — Other Ambulatory Visit (INDEPENDENT_AMBULATORY_CARE_PROVIDER_SITE_OTHER): Payer: BC Managed Care – PPO

## 2014-04-03 DIAGNOSIS — Z0000 Encounter for general adult medical examination without abnormal findings: Secondary | ICD-10-CM

## 2014-04-03 LAB — POCT URINALYSIS DIPSTICK
Bilirubin, UA: NEGATIVE
Glucose, UA: NEGATIVE
Ketones, UA: NEGATIVE
Leukocytes, UA: NEGATIVE
Nitrite, UA: NEGATIVE
Protein, UA: NEGATIVE
Spec Grav, UA: 1.025
Urobilinogen, UA: 0.2
pH, UA: 6

## 2014-04-03 LAB — BASIC METABOLIC PANEL
BUN: 16 mg/dL (ref 6–23)
CO2: 27 mEq/L (ref 19–32)
Calcium: 9.5 mg/dL (ref 8.4–10.5)
Chloride: 101 mEq/L (ref 96–112)
Creatinine, Ser: 1 mg/dL (ref 0.4–1.5)
GFR: 83.63 mL/min (ref >60.00)
Glucose, Bld: 83 mg/dL (ref 70–99)
Potassium: 4.2 mEq/L (ref 3.5–5.1)
Sodium: 138 mEq/L (ref 135–145)

## 2014-04-03 LAB — PSA: PSA: 0.36 ng/mL (ref 0.10–4.00)

## 2014-04-03 LAB — CBC WITH DIFFERENTIAL/PLATELET
Basophils Absolute: 0.1 10*3/uL (ref 0.0–0.1)
Basophils Relative: 0.9 % (ref 0.0–3.0)
Eosinophils Absolute: 0.5 10*3/uL (ref 0.0–0.7)
Eosinophils Relative: 6.9 % — ABNORMAL HIGH (ref 0.0–5.0)
HCT: 42 % (ref 39.0–52.0)
Hemoglobin: 14.7 g/dL (ref 13.0–17.0)
Lymphocytes Relative: 32 % (ref 12.0–46.0)
Lymphs Abs: 2.4 10*3/uL (ref 0.7–4.0)
MCHC: 35 g/dL (ref 30.0–36.0)
MCV: 98.8 fl (ref 78.0–100.0)
Monocytes Absolute: 0.7 10*3/uL (ref 0.1–1.0)
Monocytes Relative: 9.8 % (ref 3.0–12.0)
Neutro Abs: 3.7 10*3/uL (ref 1.4–7.7)
Neutrophils Relative %: 50.4 % (ref 43.0–77.0)
Platelets: 334 10*3/uL (ref 150.0–400.0)
RBC: 4.25 Mil/uL (ref 4.22–5.81)
RDW: 13.2 % (ref 11.5–14.6)
WBC: 7.4 10*3/uL (ref 4.5–10.5)

## 2014-04-03 LAB — TSH: TSH: 1.77 u[IU]/mL (ref 0.35–5.50)

## 2014-04-03 LAB — HEPATIC FUNCTION PANEL
ALT: 22 U/L (ref 0–53)
AST: 22 U/L (ref 0–37)
Albumin: 4.2 g/dL (ref 3.5–5.2)
Alkaline Phosphatase: 60 U/L (ref 39–117)
Bilirubin, Direct: 0.1 mg/dL (ref 0.0–0.3)
Total Bilirubin: 0.8 mg/dL (ref 0.3–1.2)
Total Protein: 7 g/dL (ref 6.0–8.3)

## 2014-04-03 LAB — LIPID PANEL
Cholesterol: 203 mg/dL — ABNORMAL HIGH (ref 0–200)
HDL: 53.7 mg/dL (ref >39.00)
LDL Cholesterol: 131 mg/dL — ABNORMAL HIGH (ref 0–99)
Total CHOL/HDL Ratio: 4
Triglycerides: 93 mg/dL (ref 0.0–149.0)
VLDL: 18.6 mg/dL (ref 0.0–40.0)

## 2014-04-06 NOTE — Progress Notes (Signed)
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 Hyperlipidemia   . GERD (gastroesophageal reflux disease)     History   Social History  . Marital Status: Married    Spouse Name: N/A    Number of Children: N/A  . Years of Education: N/A   Occupational History  . business owner    Social History Main Topics  . Smoking status: Former Research scientist (life sciences)  . Smokeless tobacco: Not on file  . Alcohol Use: No  . Drug Use: No  . Sexual Activity: Not on file   Other Topics Concern  . Not on file   Social History Narrative  . No narrative on file    No past surgical history on file.  Family History  Problem Relation Age of Onset  . Colon cancer Mother     No Known Allergies  Current Outpatient Prescriptions on File Prior to Visit  Medication Sig Dispense Refill  . esomeprazole (NEXIUM) 40 MG capsule Take 1 capsule (40 mg total) by mouth daily before breakfast.  90 capsule  0  . simvastatin (ZOCOR) 20 MG tablet TAKE 1 TABLET (20 MG TOTAL) BY MOUTH AT BEDTIME.  90 tablet  0   No current facility-administered medications on file prior to visit.     patient denies chest pain, shortness of breath, orthopnea. Denies lower extremity edema, abdominal pain, change in appetite, change in bowel movements. Patient denies rashes, musculoskeletal complaints. No other specific complaints in a complete review of systems.   Reviewed vitals Well-developed male in no acute distress. HEENT exam atraumatic, normocephalic, extraocular muscles are intact. Conjunctivae are pink without exudate. Neck is supple without lymphadenopathy, thyromegaly, jugular venous distention. Chest is clear to auscultation without increased work of breathing. Cardiac exam S1-S2 are regular. The PMI is normal. No significant murmurs or gallops. Abdominal exam active bowel sounds, soft, nontender. No abdominal bruits. Extremities no clubbing cyanosis or edema. Peripheral pulses are  normal without bruits. Neurologic exam alert and oriented without any motor or sensory deficits.   Well visit: health maintenance UTD  Discussed cancer risks- he is a remote smoker No evaluation necessary at this time

## 2014-04-07 ENCOUNTER — Ambulatory Visit (INDEPENDENT_AMBULATORY_CARE_PROVIDER_SITE_OTHER): Payer: BC Managed Care – PPO | Admitting: Internal Medicine

## 2014-04-07 ENCOUNTER — Encounter: Payer: Self-pay | Admitting: Internal Medicine

## 2014-04-07 VITALS — BP 114/78 | HR 64 | Temp 98.0°F | Ht 70.5 in | Wt 181.0 lb

## 2014-04-07 DIAGNOSIS — Z0000 Encounter for general adult medical examination without abnormal findings: Secondary | ICD-10-CM

## 2014-04-07 NOTE — Progress Notes (Signed)
Pre visit review using our clinic review tool, if applicable. No additional management support is needed unless otherwise documented below in the visit note. 

## 2014-06-16 ENCOUNTER — Other Ambulatory Visit: Payer: Self-pay | Admitting: Internal Medicine

## 2015-01-12 ENCOUNTER — Ambulatory Visit: Payer: BC Managed Care – PPO | Admitting: Family Medicine

## 2015-06-23 ENCOUNTER — Other Ambulatory Visit: Payer: Self-pay | Admitting: Internal Medicine

## 2015-06-29 ENCOUNTER — Other Ambulatory Visit: Payer: Self-pay | Admitting: Internal Medicine

## 2015-06-29 NOTE — Telephone Encounter (Signed)
Pt called stating that he needed a refill on his Simvastatin meds. Pt will be seeing Cory on 07/31/15 to est care. Pt was wondering if he could get a refill on his meds to last until his appt?? Please advise

## 2015-07-31 ENCOUNTER — Ambulatory Visit: Payer: Self-pay | Admitting: Adult Health

## 2015-09-14 ENCOUNTER — Ambulatory Visit: Payer: Self-pay | Admitting: Adult Health

## 2015-10-25 ENCOUNTER — Other Ambulatory Visit: Payer: Self-pay | Admitting: Adult Health

## 2015-12-22 ENCOUNTER — Other Ambulatory Visit: Payer: Self-pay | Admitting: Adult Health

## 2016-01-08 ENCOUNTER — Ambulatory Visit: Payer: Self-pay | Admitting: Adult Health

## 2016-01-12 ENCOUNTER — Ambulatory Visit (INDEPENDENT_AMBULATORY_CARE_PROVIDER_SITE_OTHER): Payer: BLUE CROSS/BLUE SHIELD | Admitting: Adult Health

## 2016-01-12 ENCOUNTER — Encounter: Payer: Self-pay | Admitting: Adult Health

## 2016-01-12 VITALS — BP 110/70 | Temp 99.4°F | Ht 70.5 in | Wt 197.4 lb

## 2016-01-12 DIAGNOSIS — Z760 Encounter for issue of repeat prescription: Secondary | ICD-10-CM

## 2016-01-12 MED ORDER — SIMVASTATIN 20 MG PO TABS: 20.0000 mg | ORAL_TABLET | Freq: Every day | ORAL | Status: DC

## 2016-01-12 NOTE — Progress Notes (Signed)
Pre visit review using our clinic review tool, if applicable. No additional management support is needed unless otherwise documented below in the visit note. 

## 2016-01-12 NOTE — Progress Notes (Signed)
   Subjective:    Patient ID: Kyle Palmer, male    DOB: 15-Mar-1961, 55 y.o.   MRN: [id number]  HPI  55 year old male who presents to the office today for medication refill. He is an old patient of Dr. Leanne Chang who has not made an appointment to establish with anyone else. He would like to have a a refill of his simvastatin. He endorses not taking the medication every day due to forgetting.   He would like a to have a male PCP who is his age.     Review of Systems  Respiratory: Negative.   Cardiovascular: Negative.   Musculoskeletal: Negative.   All other systems reviewed and are negative.  Past Medical History  Diagnosis Date  . Hyperlipidemia   . GERD (gastroesophageal reflux disease)     Social History   Social History  . Marital Status: Married    Spouse Name: N/A  . Number of Children: N/A  . Years of Education: N/A   Occupational History  . business owner    Social History Main Topics  . Smoking status: Former Research scientist (life sciences)  . Smokeless tobacco: Not on file  . Alcohol Use: No  . Drug Use: No  . Sexual Activity: Not on file   Other Topics Concern  . Not on file   Social History Narrative    No past surgical history on file.  Family History  Problem Relation Age of Onset  . Colon cancer Mother     No Known Allergies  Current Outpatient Prescriptions on File Prior to Visit  Medication Sig Dispense Refill  . esomeprazole (NEXIUM) 40 MG capsule Take 40 mg by mouth daily as needed.     No current facility-administered medications on file prior to visit.    BP 110/70 mmHg  Temp(Src) 99.4 F (37.4 C) (Oral)  Ht 5' 10.5" (1.791 m)  Wt 197 lb 6.4 oz (89.54 kg)  BMI 27.91 kg/m2       Objective:   Physical Exam  Constitutional: He is oriented to person, place, and time. He appears well-developed and well-nourished. No distress.  Cardiovascular: Normal rate, regular rhythm, normal heart sounds and intact distal pulses.  Exam reveals no gallop and no  friction rub.   No murmur heard. Pulmonary/Chest: Effort normal and breath sounds normal. No respiratory distress. He has no wheezes. He has no rales. He exhibits no tenderness.  Neurological: He is alert and oriented to person, place, and time.  Skin: Skin is warm and dry. No rash noted. He is not diaphoretic. No erythema. No pallor.  Psychiatric: He has a normal mood and affect. His behavior is normal. Thought content normal.  Nursing note and vitals reviewed.     Assessment & Plan:  1. Medication refill - simvastatin (ZOCOR) 20 MG tablet; Take 1 tablet (20 mg total) by mouth at bedtime.  Dispense: 30 tablet; Refill: 0 - Make an appointment to establish care with a new PCP

## 2016-01-12 NOTE — Patient Instructions (Signed)
It was great meeting you today!  [facility] [address]   Make an appointment for follow up with a new PCP.

## 2016-06-20 ENCOUNTER — Ambulatory Visit (INDEPENDENT_AMBULATORY_CARE_PROVIDER_SITE_OTHER): Payer: BLUE CROSS/BLUE SHIELD | Admitting: Family Medicine

## 2016-06-20 ENCOUNTER — Encounter: Payer: Self-pay | Admitting: Family Medicine

## 2016-06-20 VITALS — BP 138/85 | HR 81 | Temp 98.6°F | Resp 12 | Ht 70.5 in | Wt 196.0 lb

## 2016-06-20 DIAGNOSIS — Z0000 Encounter for general adult medical examination without abnormal findings: Secondary | ICD-10-CM

## 2016-06-20 DIAGNOSIS — N400 Enlarged prostate without lower urinary tract symptoms: Secondary | ICD-10-CM

## 2016-06-20 DIAGNOSIS — K219 Gastro-esophageal reflux disease without esophagitis: Secondary | ICD-10-CM

## 2016-06-20 DIAGNOSIS — Z125 Encounter for screening for malignant neoplasm of prostate: Secondary | ICD-10-CM

## 2016-06-20 DIAGNOSIS — E785 Hyperlipidemia, unspecified: Secondary | ICD-10-CM

## 2016-06-20 MED ORDER — SIMVASTATIN 20 MG PO TABS: 20.0000 mg | ORAL_TABLET | Freq: Every day | ORAL | Status: DC

## 2016-06-20 NOTE — Progress Notes (Signed)
Pre visit review using our clinic review tool, if applicable. No additional management support is needed unless otherwise documented below in the visit note. 

## 2016-06-20 NOTE — Patient Instructions (Signed)
A few things to remember from today's visit:   1. Routine physical examination  - Comprehensive metabolic panel; Future - Lipid panel; Future - PSA; Future  2. Gastroesophageal reflux disease without esophagitis   3. Hyperlipidemia  - Lipid panel; Future  4. Prostate cancer screening  - PSA; Future      At least 150 minutes of moderate exercise per week, daily brisk walking for 15-30 min is a good exercise option. Healthy diet low in saturated (animal) fats and sweets and consisting of fresh fruits and vegetables, lean meats such as fish and white chicken and whole grains.  - Vaccines:  Tdap vaccine every 10 years.  Shingles vaccine recommended at age 38, could be given after 57 years of age but not sure about insurance coverage.  Pneumonia vaccines:  Prevnar 69 at 53 and Pneumovax at 48.   -Screening recommendations for low/normal risk males:  Screening for diabetes at age 39-45 and every 3 years.       Colonoscopy for colon cancer screening at age 69 and until age 42.  Prostate cancer screening: some controversy.         If you sign-up for My chart, you can communicate easier with [place] in case you have any question or concern.

## 2016-06-20 NOTE — Progress Notes (Signed)
HPI:   [name] is a 55 y.o. male, who is here today to establish care with me. He is also due for his routine physical exam and since he didn't voice any concern initially, we decided to do a routine physical today.  Former PCP: Dr Tonye Royalty. Last preventive routine visit: Over a year ago.  Concerns today: Blood pressure elevated today, he denies any history of hypertension, he doesn't check his blood pressure regularly.  GERD: He is currently on Nexium, over-the-counter, which he takes as needed.  Denies abdominal pain, nausea, vomiting, changes in bowel habits, blood in stool or melena. Occasional symptom, depending what he eats.    Hyperlipidemia:  He is on Zocor 20 mg, which he takes 3-4 times per week. He is not following a low fat diet. He has not noted side effects with medication.   Lab Results  Component Value Date   CHOL 203* 04/03/2014   HDL 53.70 04/03/2014   LDLCALC 131* 04/03/2014   LDLDIRECT 146.1 04/13/2010   TRIG 93.0 04/03/2014   CHOLHDL 4 04/03/2014     Lab Results  Component Value Date   PSA 0.36 04/03/2014   PSA 0.31 04/13/2010    Colonoscopy q 5 years due to + FHx for colon cancer.  He has life insurance physical a couple months ago and he is positive hepatitis C screening was done, he also recall having it done years ago. He does not exercise regularly, plays tennis once a week. He has not been consistent with a healthy diet.  Lives with wife and 81 yo.   Review of Systems  Constitutional: Negative for fever, activity change, appetite change, fatigue and unexpected weight change.  HENT: Negative for dental problem, nosebleeds, sore throat, trouble swallowing and voice change.   Eyes: Negative for redness and visual disturbance.  Respiratory: Negative for apnea, cough, shortness of breath and wheezing.   Cardiovascular: Negative for chest pain, palpitations and leg swelling.  Gastrointestinal: Negative for nausea,  vomiting, abdominal pain and blood in stool.  Endocrine: Negative for polydipsia and polyuria.  Genitourinary: Negative for dysuria, frequency, hematuria, decreased urine volume, genital sores and testicular pain.       Nocturia x 1 and urine dribbling.   Musculoskeletal: Negative for myalgias, back pain, joint swelling and arthralgias.  Skin: Negative for color change and rash.  Neurological: Negative for dizziness, seizures, syncope, weakness, numbness and headaches.  Hematological: Negative for adenopathy. Does not bruise/bleed easily.  Psychiatric/Behavioral: Negative for confusion and sleep disturbance. The patient is not nervous/anxious.       Current Outpatient Prescriptions on File Prior to Visit  Medication Sig Dispense Refill  . esomeprazole (NEXIUM) 40 MG capsule Take 40 mg by mouth daily as needed.     No current facility-administered medications on file prior to visit.     Past Medical History  Diagnosis Date  . Hyperlipidemia   . GERD (gastroesophageal reflux disease)    No Known Allergies  Family History  Problem Relation Age of Onset  . Colon cancer Mother   . Hypertension Father     Social History   Social History  . Marital Status: Married    Spouse Name: N/A  . Number of Children: N/A  . Years of Education: N/A   Occupational History  . business owner    Social History Main Topics  . Smoking status: Former Research scientist (life sciences)  . Smokeless tobacco: None  . Alcohol Use: No  . Drug Use: No  .  Sexual Activity: Not Asked   Other Topics Concern  . None   Social History Narrative    Filed Vitals:   06/20/16 1405  BP: 138/85  Pulse: 81  Temp: 98.6 F (37 C)  Resp: 12    Body mass index is 27.72 kg/(m^2).  SpO2 Readings from Last 3 Encounters:  06/20/16 98%    Wt Readings from Last 3 Encounters:  06/20/16 196 lb (88.905 kg)  01/12/16 197 lb 6.4 oz (89.54 kg)  04/07/14 181 lb (82.101 kg)      Physical Exam  Constitutional: He is oriented  to person, place, and time. He appears well-developed. No distress.  HENT:  Head: Atraumatic.  Right Ear: Hearing, tympanic membrane, external ear and ear canal normal.  Left Ear: Hearing, tympanic membrane, external ear and ear canal normal.  Mouth/Throat: Uvula is midline, oropharynx is clear and moist and mucous membranes are normal.  Eyes: Conjunctivae and EOM are normal. Pupils are equal, round, and reactive to light.  Neck: Normal range of motion. No tracheal deviation present. No thyromegaly present.  Cardiovascular: Normal rate and regular rhythm.   No murmur heard. Pulses:      Dorsalis pedis pulses are 2+ on the right side, and 2+ on the left side.  Respiratory: Effort normal and breath sounds normal. No respiratory distress.  GI: Soft. He exhibits no mass. There is no tenderness. A hernia is present. Hernia confirmed positive in the right inguinal area (no pain elicited.). Hernia confirmed negative in the left inguinal area.  Genitourinary: Testes normal and penis normal. Rectal exam shows no mass, no tenderness and anal tone normal. Prostate is not tender. Enlarged: mild.  Musculoskeletal: He exhibits no edema or tenderness.  No major deformities appreciated and no signs of synovitis.  Lymphadenopathy:    He has no cervical adenopathy.       Right: No inguinal and no supraclavicular adenopathy present.       Left: No inguinal and no supraclavicular adenopathy present.  Neurological: He is alert and oriented to person, place, and time. He has normal strength. No cranial nerve deficit or sensory deficit. Coordination and gait normal.  Reflex Scores:      Bicep reflexes are 2+ on the right side and 2+ on the left side.      Patellar reflexes are 2+ on the right side and 2+ on the left side. Skin: Skin is warm. No erythema.  Psychiatric: His speech is normal. His mood appears anxious.  Well groomed, good eye contact.      ASSESSMENT AND PLAN:    Kyle Palmer was seen today for  transfer from [facility].  Diagnoses and all orders for this visit:  Routine physical examination    We discussed the importance of regular physical activity and healthy diet for prevention of chronic illness and/or complications. Preventive guidelines reviewed. Some general recommendations about vaccination. Next CPE in 1 year.     -     Comprehensive metabolic panel; Future -     Lipid panel; Future -     PSA; Future  Gastroesophageal reflux disease without esophagitis   GERD precautions discussed. PPI side effects review.  He could also try Zantac over-the-counter for symptoms as needed. Follow-up in 12 months, before if needed.   Hyperlipidemia  No changes in current management, will follow labs done today and will give further recommendations accordingly. Low fat diet discussed. Try to be more complaint with medication.   -     Lipid panel; Future -  simvastatin (ZOCOR) 20 MG tablet; Take 1 tablet (20 mg total) by mouth at bedtime.   Prostate cancer screening  After discussion of guidelines, he would like to have PSA.  He is very concerned about prostate cancer, he mentions that some of his friends had been diagnosed with prostate cancer lately.  -     PSA; Future   BPH (benign prostatic hyperplasia)  Mild symptoms. We discussed pharmacologic treatment options, he prefers to hold on medication for now.   Today he also R inguinal hernia was found on examination, he is asymptomatic. Discussed options: Surgeon evaluation vs monitoring for now, he prefers the latter one, clearly instructed about warning signs. Blood pressure recheck, within normal limits. Recommend checking blood pressure periodically, if possible.    He is not fasting today, so future labs placed. As far as his chronic medical conditions are stable, I think he can follow once per year.      Abid Bolla G. [place], MD  [facility]. [place] office.

## 2016-06-27 ENCOUNTER — Other Ambulatory Visit (INDEPENDENT_AMBULATORY_CARE_PROVIDER_SITE_OTHER): Payer: BLUE CROSS/BLUE SHIELD

## 2016-06-27 DIAGNOSIS — E785 Hyperlipidemia, unspecified: Secondary | ICD-10-CM | POA: Diagnosis not present

## 2016-06-27 DIAGNOSIS — Z125 Encounter for screening for malignant neoplasm of prostate: Secondary | ICD-10-CM | POA: Diagnosis not present

## 2016-06-27 DIAGNOSIS — Z832 Family history of diseases of the blood and blood-forming organs and certain disorders involving the immune mechanism: Secondary | ICD-10-CM

## 2016-06-27 DIAGNOSIS — Z0000 Encounter for general adult medical examination without abnormal findings: Secondary | ICD-10-CM | POA: Diagnosis not present

## 2016-06-27 LAB — COMPREHENSIVE METABOLIC PANEL
ALT: 29 U/L (ref 0–53)
AST: 20 U/L (ref 0–37)
Albumin: 4.3 g/dL (ref 3.5–5.2)
Alkaline Phosphatase: 83 U/L (ref 39–117)
BUN: 21 mg/dL (ref 6–23)
CO2: 29 mEq/L (ref 19–32)
Calcium: 9.7 mg/dL (ref 8.4–10.5)
Chloride: 103 mEq/L (ref 96–112)
Creatinine, Ser: 1.11 mg/dL (ref 0.40–1.50)
GFR: 72.69 mL/min (ref >60.00)
Glucose, Bld: 101 mg/dL — ABNORMAL HIGH (ref 70–99)
Potassium: 4.2 mEq/L (ref 3.5–5.1)
Sodium: 140 mEq/L (ref 135–145)
Total Bilirubin: 0.5 mg/dL (ref 0.2–1.2)
Total Protein: 6.8 g/dL (ref 6.0–8.3)

## 2016-06-27 LAB — LIPID PANEL
Cholesterol: 287 mg/dL — ABNORMAL HIGH (ref 0–200)
HDL: 48 mg/dL (ref >39.00)
LDL Cholesterol: 213 mg/dL — ABNORMAL HIGH (ref 0–99)
NonHDL: 238.7
Total CHOL/HDL Ratio: 6
Triglycerides: 127 mg/dL (ref 0.0–149.0)
VLDL: 25.4 mg/dL (ref 0.0–40.0)

## 2016-06-27 LAB — PSA: PSA: 0.49 ng/mL (ref 0.10–4.00)

## 2016-07-02 LAB — PROTHROMBIN GENE MUTATION

## 2016-07-13 ENCOUNTER — Telehealth: Payer: Self-pay | Admitting: Family Medicine

## 2016-07-13 NOTE — Telephone Encounter (Signed)
Pt would like to have lab result (DNA) that was sent in to the office.

## 2016-07-13 NOTE — Telephone Encounter (Signed)
Called and gave patient lab results & pt verbalized understanding. Patient wanted a copy set up front for him as well.

## 2016-08-29 ENCOUNTER — Encounter: Payer: Self-pay | Admitting: Family Medicine

## 2016-08-29 ENCOUNTER — Ambulatory Visit (INDEPENDENT_AMBULATORY_CARE_PROVIDER_SITE_OTHER): Payer: BLUE CROSS/BLUE SHIELD | Admitting: Family Medicine

## 2016-08-29 VITALS — BP 124/90 | HR 75 | Resp 12 | Ht 70.5 in | Wt 197.0 lb

## 2016-08-29 DIAGNOSIS — R253 Fasciculation: Secondary | ICD-10-CM | POA: Diagnosis not present

## 2016-08-29 NOTE — Progress Notes (Signed)
HPI:  ACUTE VISIT:  Chief Complaint  Patient presents with  . Right thumb    Started over a week ago, thumb twitching. Patient hit his palm on the side of his boat rail.    [name] is a 55 y.o. male, who is here today complaining of "twitching" muscle, palm right hand, noted about a week ago.  2 days before problem started, he had a MI nor right hand trauma: "slamped" hard againts boat rail, no problem noted after incident, not sure if this is related.  He denies headache,cervical pain, numbness, tingling, burning sensation, rash or skin changes, focal weakness,or arthralgias/limitation ROM.  Initially fasciculation was intermittently but for the past couple days he has been constant.  According to patient, in the past he has had seldom "twisting" on either hand or upper eyelids, short duration, and with no associated symptoms.   He is afraid of this being related to passing some or other neurologic illness.  He has not tried any OTC medication. He denies alcohol abuse, he drinks about 2 glasses of wine daily.  Right handed.    Chemistry      Component Value Date/Time   NA 140 06/27/2016 0806   K 4.2 06/27/2016 0806   CL 103 06/27/2016 0806   CO2 29 06/27/2016 0806   BUN 21 06/27/2016 0806   CREATININE 1.11 06/27/2016 0806      Component Value Date/Time   CALCIUM 9.7 06/27/2016 0806   ALKPHOS 83 06/27/2016 0806   AST 20 06/27/2016 0806   ALT 29 06/27/2016 0806   BILITOT 0.5 06/27/2016 0806       Review of Systems  Constitutional: Negative for activity change, appetite change, fatigue, fever and unexpected weight change.  HENT: Negative for facial swelling, mouth sores, nosebleeds, trouble swallowing and voice change.   Eyes: Negative for photophobia, pain and visual disturbance.  Respiratory: Negative for cough, shortness of breath and wheezing.   Cardiovascular: Negative for chest pain, palpitations and leg swelling.  Gastrointestinal:  Negative for abdominal pain, nausea and vomiting.  Genitourinary: Negative for decreased urine volume, difficulty urinating and hematuria.  Musculoskeletal: Negative for arthralgias, back pain and gait problem.  Skin: Negative for color change and rash.  Neurological: Negative for dizziness, tremors, seizures, syncope, facial asymmetry, weakness, numbness and headaches.  Hematological: Negative for adenopathy. Does not bruise/bleed easily.  Psychiatric/Behavioral: Negative for confusion. The patient is nervous/anxious.       Current Outpatient Prescriptions on File Prior to Visit  Medication Sig Dispense Refill  . esomeprazole (NEXIUM) 40 MG capsule Take 40 mg by mouth daily as needed.    . simvastatin (ZOCOR) 20 MG tablet Take 1 tablet (20 mg total) by mouth at bedtime. 90 tablet 2   No current facility-administered medications on file prior to visit.      Past Medical History:  Diagnosis Date  . GERD (gastroesophageal reflux disease)   . Hyperlipidemia    No Known Allergies  Social History   Social History  . Marital status: Married    Spouse name: N/A  . Number of children: N/A  . Years of education: N/A   Occupational History  . business owner    Social History Main Topics  . Smoking status: Former Research scientist (life sciences)  . Smokeless tobacco: None  . Alcohol use No  . Drug use: No  . Sexual activity: Not Asked   Other Topics Concern  . None   Social History Narrative  . None  Vitals:   08/29/16 1110  BP: 124/90  Pulse: 75  Resp: 12   O2 sat at RA 98%   Body mass index is 27.87 kg/m.     Physical Exam  Nursing note and vitals reviewed. Constitutional: He is oriented to person, place, and time. He appears well-developed and well-nourished. He does not appear ill. No distress.  HENT:  Head: Atraumatic.  Mouth/Throat: Uvula is midline, oropharynx is clear and moist and mucous membranes are normal.  Eyes: Conjunctivae and EOM are normal. Pupils are equal,  round, and reactive to light.  Neck: No muscular tenderness present. No thyroid mass and no thyromegaly present.  Cardiovascular: Normal rate and regular rhythm.   Pulses:      Radial pulses are 2+ on the right side, and 2+ on the left side.       Dorsalis pedis pulses are 2+ on the left side.       Posterior tibial pulses are 2+ on the left side.  Respiratory: Effort normal and breath sounds normal. No respiratory distress.  Musculoskeletal: He exhibits no edema.       Arms: No significant joint deformity or signs of synovitis appreciated. Wrist, MCP, and IP normal ROM bilateral.  Lymphadenopathy:    He has no cervical adenopathy.  Neurological: He is alert and oriented to person, place, and time. He has normal strength. He displays no tremor. No cranial nerve deficit or sensory deficit. Coordination and gait normal.  Reflex Scores:      Bicep reflexes are 2+ on the right side and 2+ on the left side. Minimal fasciculation like movement appreciated on small area thenar aspect right hand (see MS), no atrophy. Pronator drift negative.  Skin: Skin is warm. No ecchymosis and no rash noted. No erythema.  Psychiatric: His speech is normal. His mood appears anxious. Cognition and memory are normal.  Well groomed, good eye contact.      ASSESSMENT AND PLAN:     Kyl was seen today for right thumb.  Diagnoses and all orders for this visit:  Muscular fasciculation  Today neurologic examination otherwise negative, also history do not suggest a concerning process. Hand trauma might have trigger problem. I do not think lab work is needed today but needs to be considered if symptom is persistent.  We discussed possible causes, certainly this can be the beginning of a systemic illness but it can also be benign fasciculation syndrome. We could monitor for now. If worsening or spreading to other areas of the body or any other associated symptom I instructed him to let me know thorough my  chart; in which case brain imaging,EMG, and/or neurology consultation may be necessary.  He was instructed about warning signs. He agrees with plan.      Return if symptoms worsen or fail to improve.     -Mr.VILLA HOLLRAH was advised to return or notify a doctor immediately if symptoms worsen or persist or new concerns arise, he voices understanding.       Brendia Dampier G. [place], MD  [facility]. [place] office.

## 2016-08-29 NOTE — Progress Notes (Signed)
Pre visit review using our clinic review tool, if applicable. No additional management support is needed unless otherwise documented below in the visit note. 

## 2016-08-29 NOTE — Patient Instructions (Addendum)
A few things to remember from today's visit:   Muscular fasciculation  I think we can monitor for now, if it becomes persistent or you have other muscles involved we may need further testing, including nerve conduction studies and/or brain imaging.  Please be sure medication list is accurate. If a new problem present, please set up appointment sooner than planned today.

## 2016-10-13 ENCOUNTER — Ambulatory Visit (INDEPENDENT_AMBULATORY_CARE_PROVIDER_SITE_OTHER): Payer: BLUE CROSS/BLUE SHIELD | Admitting: Family Medicine

## 2016-10-13 ENCOUNTER — Encounter: Payer: Self-pay | Admitting: Family Medicine

## 2016-10-13 VITALS — BP 118/70 | HR 91 | Temp 99.6°F | Ht 70.5 in | Wt 191.0 lb

## 2016-10-13 DIAGNOSIS — J069 Acute upper respiratory infection, unspecified: Secondary | ICD-10-CM

## 2016-10-13 NOTE — Patient Instructions (Signed)
INSTRUCTIONS FOR UPPER RESPIRATORY INFECTION:  -plenty of rest and fluids  -nasal saline wash 2-3 times daily (use prepackaged nasal saline or bottled/distilled water if making your own)   -can use AFRIN nasal spray for drainage and nasal congestion - but do NOT use longer then 3-4 days  -can use tylenol (in no history of liver disease) or ibuprofen (if no history of kidney disease, bowel bleeding or significant heart disease) as directed for aches and sorethroat  -in the winter time, using a humidifier at night is helpful (please follow cleaning instructions)  -if you are taking a cough medication - use only as directed, may also try a teaspoon of honey to coat the throat and throat lozenges.  -for sore throat, salt water gargles can help  -follow up if you have fevers, facial pain, tooth pain, difficulty breathing or are worsening or symptoms persist longer then expected  Upper Respiratory Infection, Adult An upper respiratory infection (URI) is also known as the common cold. It is often caused by a type of germ (virus). Colds are easily spread (contagious). You can pass it to others by kissing, coughing, sneezing, or drinking out of the same glass. Usually, you get better in 1 to 3  weeks.  However, the cough can last for even longer. HOME CARE   Only take medicine as told by your doctor. Follow instructions provided above.  Drink enough water and fluids to keep your pee (urine) clear or pale yellow.  Get plenty of rest.  Return to work when your temperature is < 100 for 24 hours or as told by your doctor. You may use a face mask and wash your hands to stop your cold from spreading. GET HELP RIGHT AWAY IF:   After the first few days, you feel you are getting worse.  You have questions about your medicine.  You have chills, shortness of breath, or red spit (mucus).  You have pain in the face for more then 1-2 days, especially when you bend forward.  You have a fever, puffy  (swollen) neck, pain when you swallow, or white spots in the back of your throat.  You have a bad headache, ear pain, sinus pain, or chest pain.  You have a high-pitched whistling sound when you breathe in and out (wheezing).  You cough up blood.  You have sore muscles or a stiff neck. MAKE SURE YOU:   Understand these instructions.  Will watch your condition.  Will get help right away if you are not doing well or get worse. Document Released: 05/23/2008 Document Revised: 02/27/2012 Document Reviewed: 03/12/2014 [employer] Patient Information 2015 [employer]. This information is not intended to replace advice given to you by your health care provider. Make sure you discuss any questions you have with your health care provider.  

## 2016-10-13 NOTE — Progress Notes (Signed)
Pre visit review using our clinic review tool, if applicable. No additional management support is needed unless otherwise documented below in the visit note. 

## 2016-10-13 NOTE — Progress Notes (Signed)
HPI:  URI: -started: 3 days ago, now feeling better -symptoms:nasal congestion, sore throat, cough -denies:fever, SOB, NVD, tooth pain -has tried: otc options, now feeling betting -sick contacts/travel/risks: no reported flu, strep or tick exposure  ROS: See pertinent positives and negatives per HPI.  Past Medical History:  Diagnosis Date  . GERD (gastroesophageal reflux disease)   . Hyperlipidemia     No past surgical history on file.  Family History  Problem Relation Age of Onset  . Colon cancer Mother   . Hypertension Father     Social History   Social History  . Marital status: Married    Spouse name: N/A  . Number of children: N/A  . Years of education: N/A   Occupational History  . business owner    Social History Main Topics  . Smoking status: Former Research scientist (life sciences)  . Smokeless tobacco: None  . Alcohol use No  . Drug use: No  . Sexual activity: Not Asked   Other Topics Concern  . None   Social History Narrative  . None     Current Outpatient Prescriptions:  .  esomeprazole (NEXIUM) 40 MG capsule, Take 40 mg by mouth daily as needed., Disp: , Rfl:  .  simvastatin (ZOCOR) 20 MG tablet, Take 1 tablet (20 mg total) by mouth at bedtime., Disp: 90 tablet, Rfl: 2  EXAM:  Vitals:   10/13/16 1417  BP: 118/70  Pulse: 91  Temp: 99.6 F (37.6 C)    Body mass index is 27.02 kg/m.  GENERAL: vitals reviewed and listed above, alert, oriented, appears well hydrated and in no acute distress  HEENT: atraumatic, conjunttiva clear, no obvious abnormalities on inspection of external nose and ears, normal appearance of ear canals and TMs, clear nasal congestion, mild post oropharyngeal erythema with PND, no tonsillar edema or exudate, no sinus TTP  NECK: no obvious masses on inspection  LUNGS: clear to auscultation bilaterally, no wheezes, rales or rhonchi, good air movement  CV: HRRR, no peripheral edema  MS: moves all extremities without noticeable  abnormality  PSYCH: pleasant and cooperative, no obvious depression or anxiety  ASSESSMENT AND PLAN:  Discussed the following assessment and plan:  Acute upper respiratory infection  -given HPI and exam findings today, a serious infection or illness is unlikely. We discussed potential etiologies, with VURI being most likely, and advised supportive care and monitoring. We discussed treatment side effects, likely course, antibiotic misuse, transmission, and signs of developing a serious illness. -of course, we advised to return or notify a doctor immediately if symptoms worsen or persist or new concerns arise.    Patient Instructions  INSTRUCTIONS FOR UPPER RESPIRATORY INFECTION:  -plenty of rest and fluids  -nasal saline wash 2-3 times daily (use prepackaged nasal saline or bottled/distilled water if making your own)   -can use AFRIN nasal spray for drainage and nasal congestion - but do NOT use longer then 3-4 days  -can use tylenol (in no history of liver disease) or ibuprofen (if no history of kidney disease, bowel bleeding or significant heart disease) as directed for aches and sorethroat  -in the winter time, using a humidifier at night is helpful (please follow cleaning instructions)  -if you are taking a cough medication - use only as directed, may also try a teaspoon of honey to coat the throat and throat lozenges.  -for sore throat, salt water gargles can help  -follow up if you have fevers, facial pain, tooth pain, difficulty breathing or are worsening or  symptoms persist longer then expected  Upper Respiratory Infection, Adult An upper respiratory infection (URI) is also known as the common cold. It is often caused by a type of germ (virus). Colds are easily spread (contagious). You can pass it to others by kissing, coughing, sneezing, or drinking out of the same glass. Usually, you get better in 1 to 3  weeks.  However, the cough can last for even longer. HOME CARE    Only take medicine as told by your doctor. Follow instructions provided above.  Drink enough water and fluids to keep your pee (urine) clear or pale yellow.  Get plenty of rest.  Return to work when your temperature is < 100 for 24 hours or as told by your doctor. You may use a face mask and wash your hands to stop your cold from spreading. GET HELP RIGHT AWAY IF:   After the first few days, you feel you are getting worse.  You have questions about your medicine.  You have chills, shortness of breath, or red spit (mucus).  You have pain in the face for more then 1-2 days, especially when you bend forward.  You have a fever, puffy (swollen) neck, pain when you swallow, or white spots in the back of your throat.  You have a bad headache, ear pain, sinus pain, or chest pain.  You have a high-pitched whistling sound when you breathe in and out (wheezing).  You cough up blood.  You have sore muscles or a stiff neck. MAKE SURE YOU:   Understand these instructions.  Will watch your condition.  Will get help right away if you are not doing well or get worse. Document Released: 05/23/2008 Document Revised: 02/27/2012 Document Reviewed: 03/12/2014 [facility] Patient Information 2015 [place], [place]. This information is not intended to replace advice given to you by your health care provider. Make sure you discuss any questions you have with your health care provider.    Colin Benton R., DO

## 2016-10-20 ENCOUNTER — Ambulatory Visit (INDEPENDENT_AMBULATORY_CARE_PROVIDER_SITE_OTHER): Payer: BLUE CROSS/BLUE SHIELD | Admitting: Family Medicine

## 2016-10-20 ENCOUNTER — Ambulatory Visit (INDEPENDENT_AMBULATORY_CARE_PROVIDER_SITE_OTHER)
Admission: RE | Admit: 2016-10-20 | Discharge: 2016-10-20 | Disposition: A | Discharge status: Home / Self Care | Payer: BLUE CROSS/BLUE SHIELD | Source: Ambulatory Visit | Attending: Family Medicine | Admitting: Family Medicine

## 2016-10-20 ENCOUNTER — Telehealth: Payer: Self-pay | Admitting: Family Medicine

## 2016-10-20 ENCOUNTER — Encounter: Payer: Self-pay | Admitting: Family Medicine

## 2016-10-20 VITALS — BP 128/82 | HR 90 | Temp 98.4°F | Resp 12 | Ht 70.5 in | Wt 190.5 lb

## 2016-10-20 DIAGNOSIS — R0989 Other specified symptoms and signs involving the circulatory and respiratory systems: Secondary | ICD-10-CM | POA: Diagnosis not present

## 2016-10-20 DIAGNOSIS — R059 Cough, unspecified: Secondary | ICD-10-CM

## 2016-10-20 DIAGNOSIS — R509 Fever, unspecified: Secondary | ICD-10-CM

## 2016-10-20 DIAGNOSIS — J989 Respiratory disorder, unspecified: Secondary | ICD-10-CM

## 2016-10-20 DIAGNOSIS — R05 Cough: Secondary | ICD-10-CM

## 2016-10-20 DIAGNOSIS — J988 Other specified respiratory disorders: Secondary | ICD-10-CM

## 2016-10-20 LAB — CBC WITH DIFFERENTIAL/PLATELET
Basophils Absolute: 0 10*3/uL (ref 0.0–0.1)
Basophils Relative: 0.3 % (ref 0.0–3.0)
Eosinophils Absolute: 0.3 10*3/uL (ref 0.0–0.7)
Eosinophils Relative: 1.8 % (ref 0.0–5.0)
HCT: 38.4 % — ABNORMAL LOW (ref 39.0–52.0)
Hemoglobin: 13.2 g/dL (ref 13.0–17.0)
Lymphocytes Relative: 11.5 % — ABNORMAL LOW (ref 12.0–46.0)
Lymphs Abs: 1.7 10*3/uL (ref 0.7–4.0)
MCHC: 34.4 g/dL (ref 30.0–36.0)
MCV: 95.1 fl (ref 78.0–100.0)
Monocytes Absolute: 1.9 10*3/uL — ABNORMAL HIGH (ref 0.1–1.0)
Monocytes Relative: 13.4 % — ABNORMAL HIGH (ref 3.0–12.0)
Neutro Abs: 10.4 10*3/uL — ABNORMAL HIGH (ref 1.4–7.7)
Neutrophils Relative %: 73 % (ref 43.0–77.0)
Platelets: 396 10*3/uL (ref 150.0–400.0)
RBC: 4.04 Mil/uL — ABNORMAL LOW (ref 4.22–5.81)
RDW: 12.9 % (ref 11.5–15.5)
WBC: 14.3 10*3/uL — ABNORMAL HIGH (ref 4.0–10.5)

## 2016-10-20 MED ORDER — AZITHROMYCIN 250 MG PO TABS: ORAL_TABLET | ORAL | 0 refills | Status: AC

## 2016-10-20 MED ORDER — ALBUTEROL SULFATE HFA 108 (90 BASE) MCG/ACT IN AERS: 2.0000 | INHALATION_SPRAY | Freq: Four times a day (QID) | RESPIRATORY_TRACT | 0 refills | Status: DC | PRN

## 2016-10-20 MED ORDER — BENZONATATE 100 MG PO CAPS: 200.0000 mg | ORAL_CAPSULE | Freq: Two times a day (BID) | ORAL | 0 refills | Status: AC | PRN

## 2016-10-20 NOTE — Telephone Encounter (Signed)
Will you look at his x-ray?

## 2016-10-20 NOTE — Progress Notes (Signed)
HPI:  ACUTE VISIT:  Chief Complaint  Patient presents with  . Cough    Kyle Palmer is a 55 y.o. male, who is here today complaining of persistent cough and fever.   He was seen on 10/13/2016 and diagnosed with a viral URI.  According to patient, symptoms started 10/10/2016 with fatigue, cough, fever, chills, and muscle aching. 10/17/16 he thought he was getting better but symptoms re-occur.  He has had subjective fever for about 10 days, last night he checked her temperature, 100.4 F Frontal pressure headache aggravated with cough.  + Nasal congestion, rhinorrhea, mild sore throat mainly with coughing, and post nasal drainage.  Most symptoms are alleviated with Advil and rest. + Productive cough with greenish sputum, which seems to be getting lighter.  He has had intermittent wheezing, denies dyspnea or chest pain.  +Former smoker, quit in 1992.  Reporting "firing" sensation on chest with coughing spells, he has history of GERD and currently he is on PPI medication.  + Night sweats and wt loss, the latter one attributed to no appetite. Denies any Hx of TB exposure.   No Hx of recent overseas travel, he travelled to [facility] a couple day a couple days before illness started. Sick contact: No No known insect bite. No Hx of allergies.  Medication OTC for this problem:Advil, last taken yesterday at 6 pm. Symptoms otherwise stable, "feeling bad." He is concerned, states that usually when he gets URI symptoms do not last more than 5-7 days, including the cough.    Review of Systems  Constitutional: Positive for appetite change, fatigue and fever. Negative for activity change and chills.  HENT: Positive for congestion and rhinorrhea. Negative for ear pain, mouth sores, postnasal drip, sinus pressure, sneezing, sore throat, trouble swallowing and voice change.   Eyes: Negative for discharge, redness and itching.  Respiratory: Positive for cough and wheezing.  Negative for chest tightness, shortness of breath and stridor.   Cardiovascular: Negative for chest pain and leg swelling.  Gastrointestinal: Negative for abdominal pain, diarrhea, nausea and vomiting.  Musculoskeletal: Positive for myalgias. Negative for arthralgias, joint swelling and neck pain.  Skin: Negative for color change and rash.  Allergic/Immunologic: Negative for environmental allergies.  Neurological: Positive for headaches. Negative for syncope and weakness.  Hematological: Negative for adenopathy. Does not bruise/bleed easily.  Psychiatric/Behavioral: Positive for sleep disturbance. Negative for confusion. The patient is nervous/anxious.       Current Outpatient Prescriptions on File Prior to Visit  Medication Sig Dispense Refill  . esomeprazole (NEXIUM) 40 MG capsule Take 40 mg by mouth daily as needed.    . simvastatin (ZOCOR) 20 MG tablet Take 1 tablet (20 mg total) by mouth at bedtime. 90 tablet 2   No current facility-administered medications on file prior to visit.      Past Medical History:  Diagnosis Date  . GERD (gastroesophageal reflux disease)   . Hyperlipidemia    No Known Allergies  Social History   Social History  . Marital status: Married    Spouse name: N/A  . Number of children: N/A  . Years of education: N/A   Occupational History  . business owner    Social History Main Topics  . Smoking status: Former Research scientist (life sciences)  . Smokeless tobacco: None  . Alcohol use No  . Drug use: No  . Sexual activity: Not Asked   Other Topics Concern  . None   Social History Narrative  . None  Vitals:   10/20/16 0922  BP: 128/82  Pulse: 90  Resp: 12  Temp: 98.4 F (36.9 C)   O2 sat 97% at RA.   Body mass index is 26.95 kg/m.     Physical Exam  Nursing note and vitals reviewed. Constitutional: He is oriented to person, place, and time. He appears well-developed and well-nourished. He does not appear ill. No distress.  HENT:  Head:  Atraumatic.  Right Ear: Tympanic membrane, external ear and ear canal normal.  Left Ear: Tympanic membrane, external ear and ear canal normal.  Nose: No rhinorrhea. Right sinus exhibits no maxillary sinus tenderness and no frontal sinus tenderness. Left sinus exhibits no maxillary sinus tenderness and no frontal sinus tenderness.  Mouth/Throat: Uvula is midline, oropharynx is clear and moist and mucous membranes are normal. No oropharyngeal exudate or posterior oropharyngeal edema.  Post nasal drainage and minimal posterior pharyngeal erythema.  Eyes: Conjunctivae and EOM are normal.  Cardiovascular: Normal rate and regular rhythm.   No murmur heard. Respiratory: Effort normal and breath sounds normal. No stridor. No respiratory distress. He has no wheezes. He has no rales.  Lymphadenopathy:       Head (right side): No submandibular adenopathy present.       Head (left side): No submandibular adenopathy present.    He has no cervical adenopathy.  Neurological: He is alert and oriented to person, place, and time. He has normal strength.  Skin: Skin is warm. No rash noted. No erythema.  Psychiatric: His speech is normal. His mood appears anxious.  Well groomed, good eye contact.      ASSESSMENT AND PLAN:     Kyle Palmer was seen today for cough.  Diagnoses and all orders for this visit:  Fever, unspecified fever cause  Recommended monitoring temperature daily. Continue symptomatic treatment with OTC medications. He can go back to work after fever free for 24 hours. CBC and CXR today, further recommendations would be given accordingly. If fever persists despite of treatment further workup would be necessary.   -     DG Chest 2 View; Future -     CBC w/Diff  Respiratory tract infection  Plan discussed possible causes, most likely vital etiology but because reporting 10 days of persistent fever Abx was recommended. Instructed about warning signs. Further recommendations will be  given according to chest x-ray.  -     azithromycin (ZITHROMAX) 250 MG tablet; 2 tabs today then one daily -     benzonatate (TESSALON) 100 MG capsule; Take 2 capsules (200 mg total) by mouth 2 (two) times daily as needed for cough. -     CBC w/Diff  Cough  Explained cough and congestion after URI can last a few weeks. OTC Mucinex might help. He has history of GERD, this came aggravate problem.  -     benzonatate (TESSALON) 100 MG capsule; Take 2 capsules (200 mg total) by mouth 2 (two) times daily as needed for cough. -     DG Chest 2 View; Future  Reactive airway disease that is not asthma  Today lung auscultation negative for rales or wheezing. Albuterol inh recommended qid for a week then as needed.  -     albuterol (PROVENTIL HFA;VENTOLIN HFA) 108 (90 Base) MCG/ACT inhaler; Inhale 2 puffs into the lungs every 6 (six) hours as needed for wheezing or shortness of breath. -     DG Chest 2 View; Future      Return if symptoms worsen or fail to  improve.     -Mr.Raford Pitcher advised to return or notify a doctor immediately if symptoms worsen or persist or new concerns arise.       Akin Yi G. Martinique, MD  [facility]. [place] office.

## 2016-10-20 NOTE — Progress Notes (Signed)
Pre visit review using our clinic review tool, if applicable. No additional management support is needed unless otherwise documented below in the visit note. 

## 2016-10-20 NOTE — Patient Instructions (Addendum)
A few things to remember from today's visit:   Respiratory tract infection - Plan: azithromycin (ZITHROMAX) 250 MG tablet, benzonatate (TESSALON) 100 MG capsule, CBC w/Diff  Cough - Plan: benzonatate (TESSALON) 100 MG capsule, DG Chest 2 View  Fever, unspecified fever cause - Plan: DG Chest 2 View, CBC w/Diff  Reactive airway disease that is not asthma - Plan: albuterol (PROVENTIL HFA;VENTOLIN HFA) 108 (90 Base) MCG/ACT inhaler, DG Chest 2 View  GERD can also aggravate cough, add Zantac 150 mg at bedtime and continue GERD precautions.  Back to work when 24 hours fever free, monitor temp daily.   Avoid foods that make your symptoms worse, for example coffee, chocolate,pepermeint,alcohol, and greasy food. Raising the head of your bed about 6 inches may help with nocturnal symptoms.   Avoid lying down for 3 hours after eating.  Instead 3 large meals daily try small and more frequent meals during the day.

## 2016-10-20 NOTE — Telephone Encounter (Signed)
Pt would like results of Xrays done today. Pt states he has a fever again.

## 2016-10-21 NOTE — Telephone Encounter (Signed)
I did already. Thanks, BJ

## 2016-12-22 ENCOUNTER — Telehealth: Payer: Self-pay | Admitting: Family Medicine

## 2016-12-22 MED ORDER — OSELTAMIVIR PHOSPHATE 75 MG PO CAPS: 75.0000 mg | ORAL_CAPSULE | Freq: Every day | ORAL | 0 refills | Status: DC

## 2016-12-22 NOTE — Telephone Encounter (Signed)
Rx sent in per Dr. Martinique.

## 2016-12-22 NOTE — Telephone Encounter (Signed)
° ° ° °  Pt is asking if a RX for Tamiflu can be called in, said his daughter has the flu    Pharmacy ;  [place]

## 2016-12-22 NOTE — Telephone Encounter (Signed)
Okay to send in 

## 2017-03-06 ENCOUNTER — Ambulatory Visit (INDEPENDENT_AMBULATORY_CARE_PROVIDER_SITE_OTHER): Payer: BLUE CROSS/BLUE SHIELD | Admitting: Family Medicine

## 2017-03-06 ENCOUNTER — Encounter: Payer: Self-pay | Admitting: Family Medicine

## 2017-03-06 VITALS — BP 130/80 | HR 70 | Resp 12 | Ht 70.5 in | Wt 194.4 lb

## 2017-03-06 DIAGNOSIS — R030 Elevated blood-pressure reading, without diagnosis of hypertension: Secondary | ICD-10-CM | POA: Diagnosis not present

## 2017-03-06 NOTE — Patient Instructions (Signed)
w things to remember from today's visit:   Elevated blood pressure reading  DASH diet recommended: high in vegetables, fruits, low-fat dairy products, whole grains, poultry, fish, and nuts; and low in sweets, sugar-sweetened beverages, and red meats. BP goal < 140/90 ideally 130/80 or less.   Monitor BP at home.   Please be sure medication list is accurate. If a new problem present, please set up appointment sooner than planned today.

## 2017-03-06 NOTE — Progress Notes (Signed)
HPI:   ACUTE VISIT:  Chief Complaint  Patient presents with  . Hypertension    Kyle Palmer is a 56 y.o. male, who is here today complaining of elevated BP today at the dentists office. BP readings this morning: 160/105, 147/108,and 152/104.  He has no Hx of HTN.   He does not monitor BP at home.  He denies taking OTC cold meds,wt loss supplements,or herbs. He states that he drank 2 cups of Cava tea last night, not sure if this could increase his BP.  Denies severe/frequent headache, visual changes, chest pain, dyspnea, palpitation, claudication, focal weakness, or edema.  He is not exercising regularly but tries to eat healthy. + Stress at work.  Hx of HLD, she is on Zocor.   Review of Systems  Constitutional: Negative for activity change, appetite change, fatigue and unexpected weight change.  HENT: Negative for nosebleeds and tinnitus.   Eyes: Negative for redness and visual disturbance.  Respiratory: Negative for cough, shortness of breath and wheezing.   Cardiovascular: Negative for chest pain, palpitations and leg swelling.  Gastrointestinal: Negative for abdominal pain, nausea and vomiting.  Genitourinary: Negative for decreased urine volume and hematuria.  Musculoskeletal: Negative for myalgias.  Skin: Negative for rash.  Neurological: Negative for dizziness, weakness, numbness and headaches.  Psychiatric/Behavioral: Negative for confusion and sleep disturbance. The patient is not nervous/anxious.       Current Outpatient Prescriptions on File Prior to Visit  Medication Sig Dispense Refill  . albuterol (PROVENTIL HFA;VENTOLIN HFA) 108 (90 Base) MCG/ACT inhaler Inhale 2 puffs into the lungs every 6 (six) hours as needed for wheezing or shortness of breath. 1 Inhaler 0  . esomeprazole (NEXIUM) 40 MG capsule Take 40 mg by mouth daily as needed.    . simvastatin (ZOCOR) 20 MG tablet Take 1 tablet (20 mg total) by mouth at bedtime. 90 tablet 2   No  current facility-administered medications on file prior to visit.      Past Medical History:  Diagnosis Date  . GERD (gastroesophageal reflux disease)   . Hyperlipidemia    No Known Allergies  Social History   Social History  . Marital status: Married    Spouse name: N/A  . Number of children: N/A  . Years of education: N/A   Occupational History  . business owner    Social History Main Topics  . Smoking status: Former Research scientist (life sciences)  . Smokeless tobacco: Never Used  . Alcohol use No  . Drug use: No  . Sexual activity: Not Asked   Other Topics Concern  . None   Social History Narrative  . None    Vitals:   03/06/17 1605 03/06/17 1637  BP: (!) 142/98 130/80  Pulse: 70   Resp: 12   O2 sat at [facility] 98% Body mass index is 27.5 kg/m.    Physical Exam  Nursing note and vitals reviewed. Constitutional: He is oriented to person, place, and time. He appears well-developed. No distress.  HENT:  Head: Atraumatic.  Mouth/Throat: Oropharynx is clear and moist and mucous membranes are normal.  Eyes: Conjunctivae and EOM are normal.  Neck: No thyroid mass and no thyromegaly present.  Cardiovascular: Normal rate and regular rhythm.   No murmur heard. Pulses:      Posterior tibial pulses are 2+ on the right side, and 2+ on the left side.  Respiratory: Effort normal and breath sounds normal. No respiratory distress.  Musculoskeletal: He exhibits no edema.  Lymphadenopathy:  He has no cervical adenopathy.  Neurological: He is alert and oriented to person, place, and time. He has normal strength. No cranial nerve deficit. Coordination and gait normal.  Skin: Skin is warm. No erythema.  Psychiatric: He has a normal mood and affect. Cognition and memory are normal.  Well groomed, good eye contact.      ASSESSMENT AND PLAN:    Kyle Palmer was seen today for hypertension.  Diagnoses and all orders for this visit:  Elevated blood pressure reading   Re-checked and  improved. Recommend continuing monitoring BP at home. Low salt diet. Instructed about warning signs. Instructed to let me know about BP readings in a few weeks. F/U as needed, keep next f/u appt.     Kyle Palmer G. Martinique, MD  [facility]. [place] office.

## 2017-03-06 NOTE — Progress Notes (Signed)
Pre visit review using our clinic review tool, if applicable. No additional management support is needed unless otherwise documented below in the visit note. 

## 2017-03-29 ENCOUNTER — Other Ambulatory Visit: Payer: Self-pay | Admitting: Family Medicine

## 2017-03-29 DIAGNOSIS — E785 Hyperlipidemia, unspecified: Secondary | ICD-10-CM

## 2017-05-10 IMAGING — DX DG CHEST 2V
2 series · 2 of 2 positions shown · non-contrast
Comparison: None.

CLINICAL DATA: Cough and fever for 2 weeks

EXAM:
CHEST  2 VIEW

[chest pa]
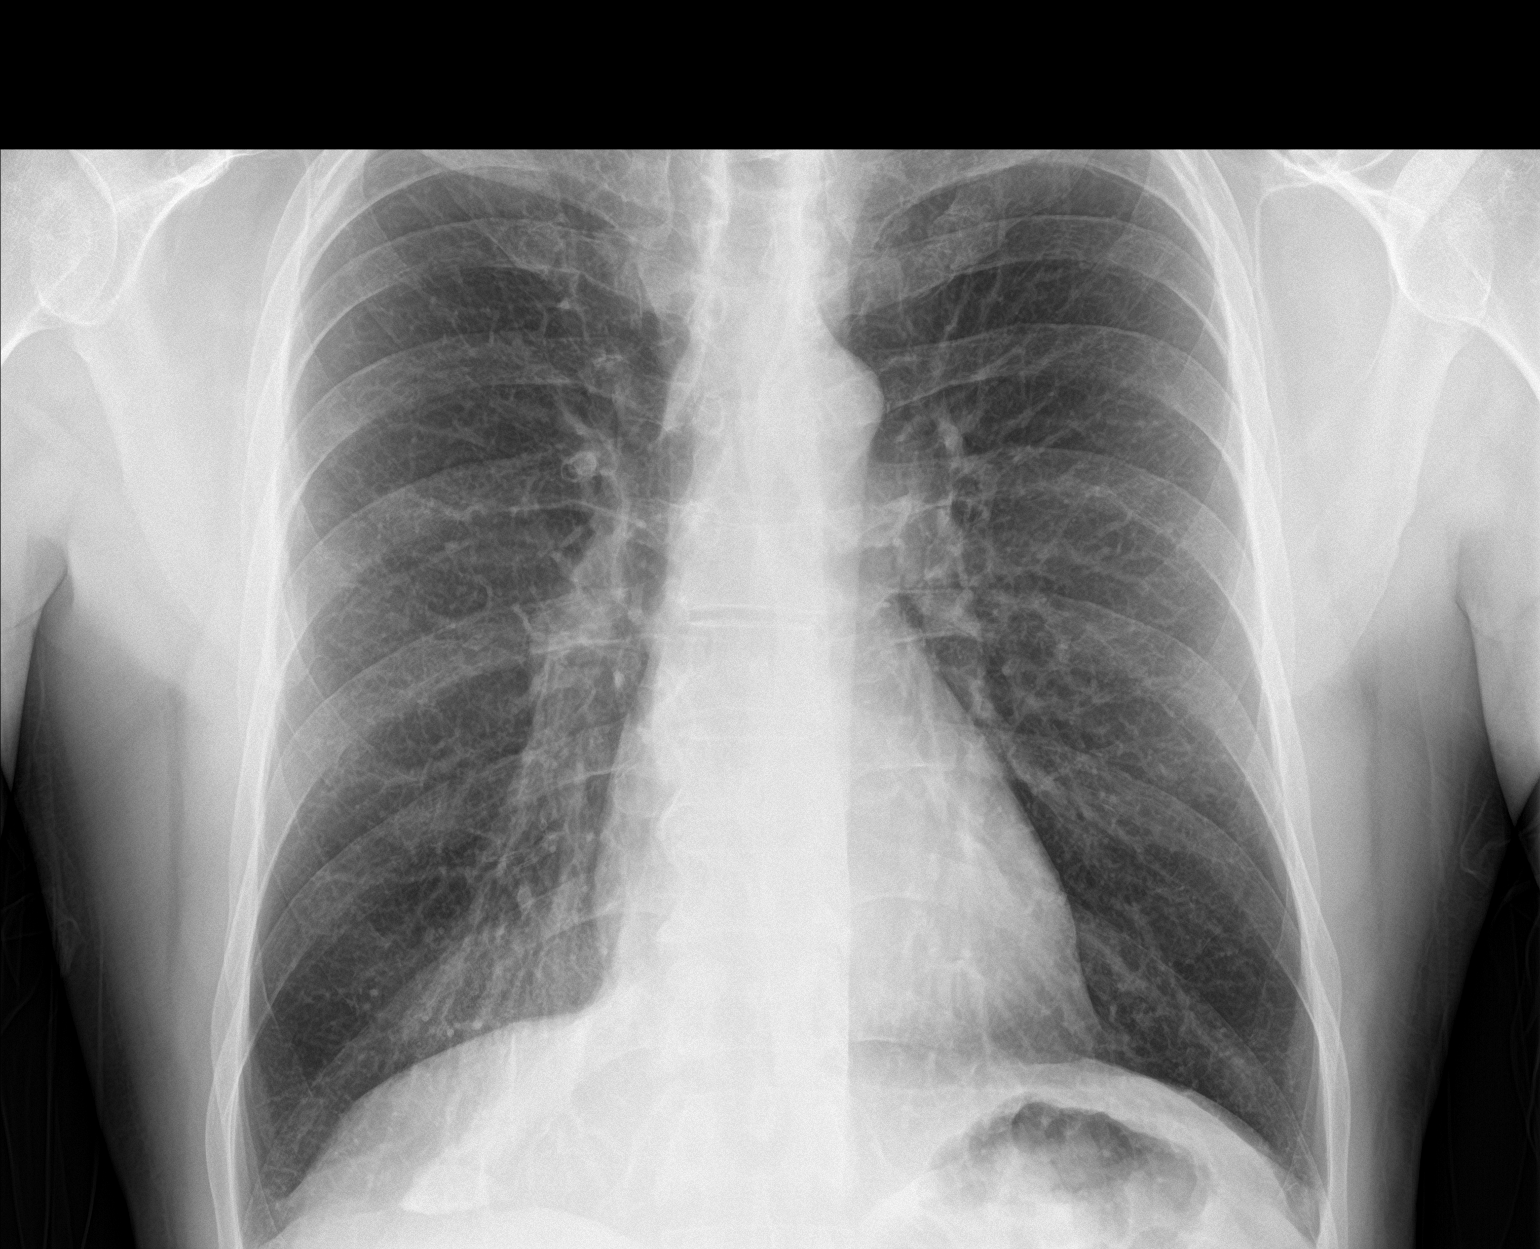

[chest lat]
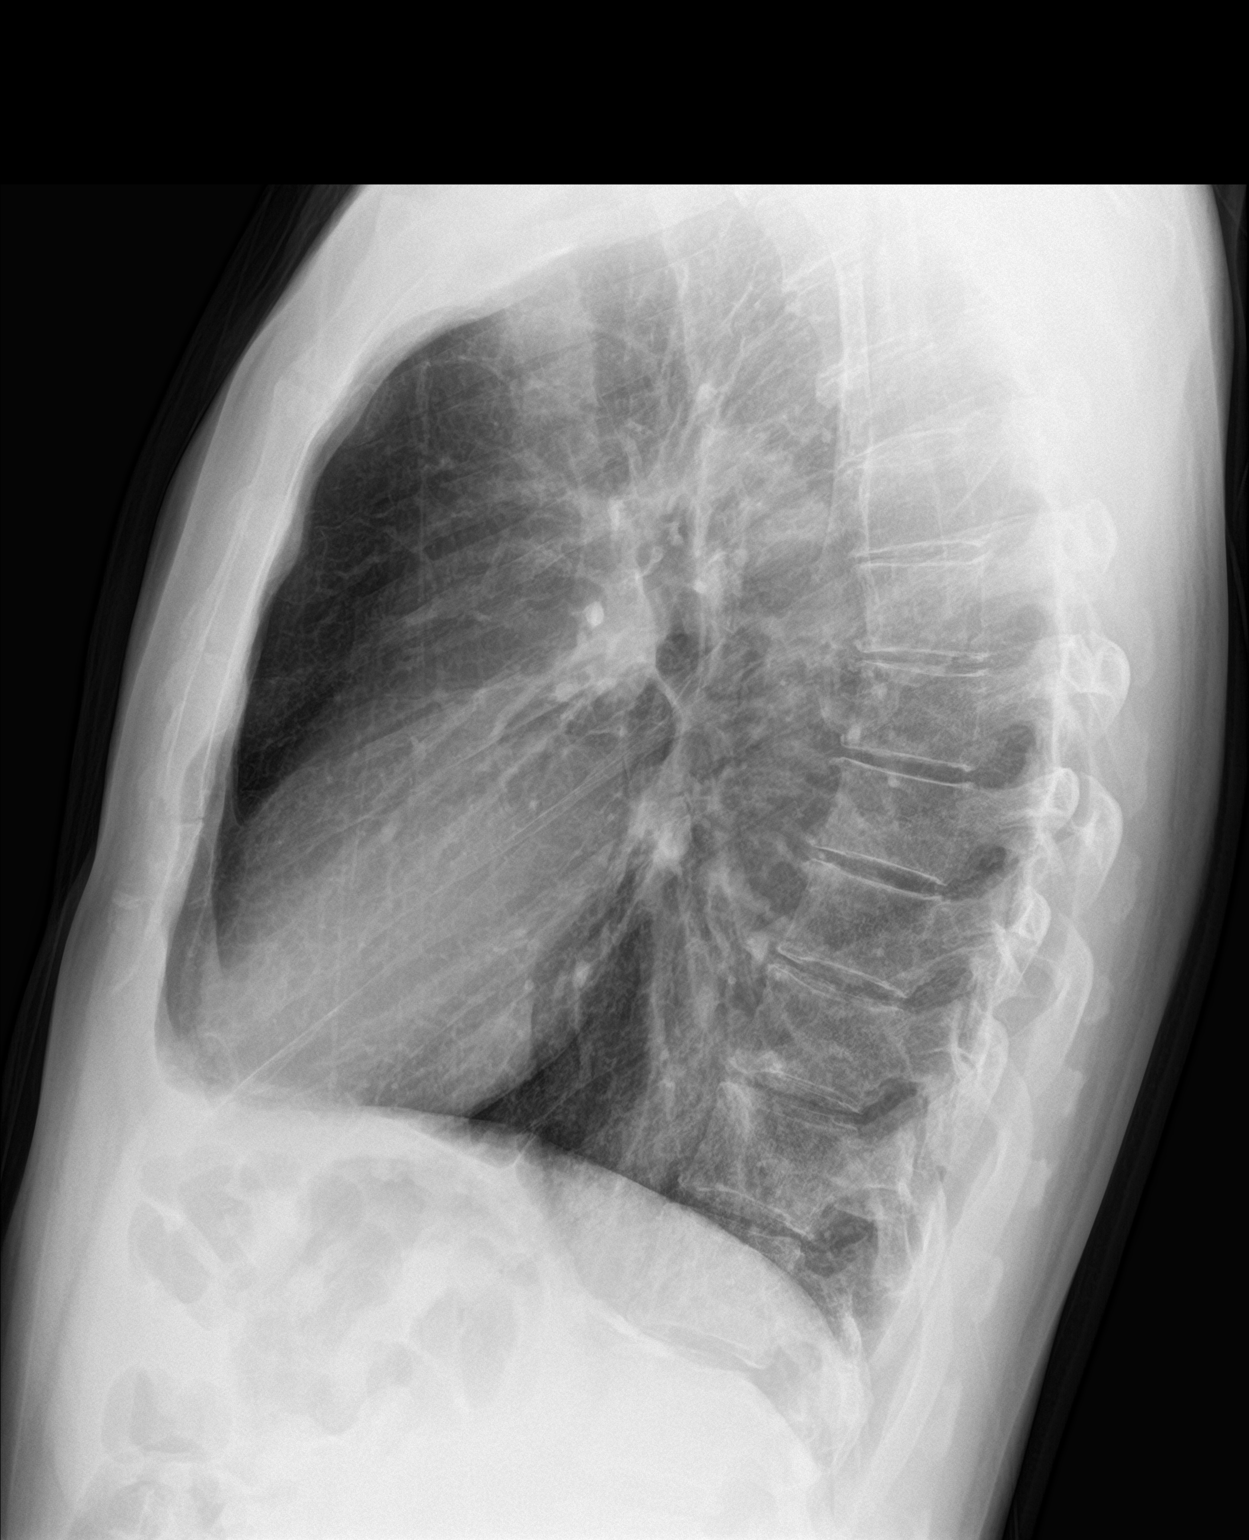

[2 of 2 positions shown; findings below may reference images not displayed]

FINDINGS: There is focal calcification along the inferior right hemidiaphragm.
There is no edema or consolidation. The heart size and pulmonary
vascularity are normal. No adenopathy. No bone lesions.
IMPRESSION: Inferior right hemidiaphragm calcification. Question previous
asbestos exposure. No edema or consolidation.

## 2017-10-04 ENCOUNTER — Other Ambulatory Visit: Payer: Self-pay | Admitting: Family Medicine

## 2017-10-04 DIAGNOSIS — E785 Hyperlipidemia, unspecified: Secondary | ICD-10-CM

## 2018-01-22 ENCOUNTER — Other Ambulatory Visit: Payer: Self-pay | Admitting: Dermatology

## 2018-06-11 ENCOUNTER — Ambulatory Visit: Payer: Self-pay

## 2018-06-11 ENCOUNTER — Ambulatory Visit: Payer: BLUE CROSS/BLUE SHIELD | Admitting: Family Medicine

## 2018-06-11 ENCOUNTER — Encounter: Payer: Self-pay | Admitting: Family Medicine

## 2018-06-11 VITALS — BP 144/92 | HR 83 | Temp 98.7°F | Resp 12 | Ht 70.5 in | Wt 196.4 lb

## 2018-06-11 DIAGNOSIS — R0789 Other chest pain: Secondary | ICD-10-CM

## 2018-06-11 DIAGNOSIS — E785 Hyperlipidemia, unspecified: Secondary | ICD-10-CM

## 2018-06-11 DIAGNOSIS — K219 Gastro-esophageal reflux disease without esophagitis: Secondary | ICD-10-CM | POA: Diagnosis not present

## 2018-06-11 DIAGNOSIS — I10 Essential (primary) hypertension: Secondary | ICD-10-CM

## 2018-06-11 NOTE — Telephone Encounter (Signed)
Patient called in with c/o "chest discomfort." He says "I ate spaghetti last night for supper and my I had indigestion and some chest pressure. I felt it all night and today off and on, with nothing felt right now. I did have pain in my right elbow and my left groin, but it went away. I took Nexium this morning, but this pressure is different than it feels with indigestion I've had before." I asked how bad is the pressure, he says "mild and it lasts maybe 5-10 minutes, then goes away." I asked about other symptoms, he says "a little dizzy, a little difficulty breathing earlier, but not now. I don't have nausea, vomiting, sweating, fever, or cough." According to protocol, see PCP within 24 hours, appointment scheduled for today at 1600 with Dr. Martinique, care advice given, patient verbalized understanding.   Reason for Disposition . [1] Chest pain lasting <= 5 minutes AND [2] NO chest pain or cardiac symptoms now(Exceptions: pains lasting a few seconds)  Answer Assessment - Initial Assessment Questions 1. LOCATION: "Where does it hurt?"       Pressure to Upper chest between nipple line in the center 2. RADIATION: "Does the pain go anywhere else?" (e.g., into neck, jaw, arms, back)     Right arm a little and lower left groin a little bit, goes away and not stay 3. ONSET: "When did the chest pain begin?" (Minutes, hours or days)      Pressure started last night after dinner, spaghetti, worse when lying down 4. PATTERN "Does the pain come and go, or has it been constant since it started?"  "Does it get worse with exertion?"      Comes and goes, not consistent; I haven't exerted myself 5. DURATION: "How long does it last" (e.g., seconds, minutes, hours)     Maybe 5-10 minutes 6. SEVERITY: "How bad is the pain?"  (e.g., Scale 1-10; mild, moderate, or severe)    - MILD (1-3): doesn't interfere with normal activities     - MODERATE (4-7): interferes with normal activities or awakens from sleep    - SEVERE  (8-10): excruciating pain, unable to do any normal activities       Mild 7. CARDIAC RISK FACTORS: "Do you have any history of heart problems or risk factors for heart disease?" (e.g., prior heart attack, angina; high blood pressure, diabetes, being overweight, high cholesterol, smoking, or strong family history of heart disease)     No, has high cholesterol, no family history 8. PULMONARY RISK FACTORS: "Do you have any history of lung disease?"  (e.g., blood clots in lung, asthma, emphysema, birth control pills)     No 9. CAUSE: "What do you think is causing the chest pain?"     I don't know 10. OTHER SYMPTOMS: "Do you have any other symptoms?" (e.g., dizziness, nausea, vomiting, sweating, fever, difficulty breathing, cough)       A little dizziness, a little difficulty breathing earlier but not now, no cough 11. PREGNANCY: "Is there any chance you are pregnant?" "When was your last menstrual period?"       N/A  Protocols used: CHEST PAIN-A-AH

## 2018-06-11 NOTE — Patient Instructions (Signed)
A few things to remember from today's visit:   Chest tightness or pressure - Plan: EKG 12-Lead, Ambulatory referral to Cardiology  Benign essential hypertension - Plan: Comprehensive metabolic panel  Hyperlipidemia, unspecified hyperlipidemia type - Plan: Lipid panel, Comprehensive metabolic panel  Gastroesophageal reflux disease without esophagitis If you change your mind about medication for blood pressure please let me know.  Options: Hydrochlorothiazide, benazepril, amlodipine, or metoprolol/carvedilol.  DASH Eating Plan DASH stands for "Dietary Approaches to Stop Hypertension." The DASH eating plan is a healthy eating plan that has been shown to reduce high blood pressure (hypertension). It may also reduce your risk for type 2 diabetes, heart disease, and stroke. The DASH eating plan may also help with weight loss. What are tips for following this plan? General guidelines  Avoid eating more than 2,300 mg (milligrams) of salt (sodium) a day. If you have hypertension, you may need to reduce your sodium intake to 1,500 mg a day.  Limit alcohol intake to no more than 1 drink a day for nonpregnant women and 2 drinks a day for men. One drink equals 12 oz of beer, 5 oz of wine, or 1 oz of hard liquor.  Work with your health care provider to maintain a healthy body weight or to lose weight. Ask what an ideal weight is for you.  Get at least 30 minutes of exercise that causes your heart to beat faster (aerobic exercise) most days of the week. Activities may include walking, swimming, or biking.  Work with your health care provider or diet and nutrition specialist (dietitian) to adjust your eating plan to your individual calorie needs. Reading food labels  Check food labels for the amount of sodium per serving. Choose foods with less than 5 percent of the Daily Value of sodium. Generally, foods with less than 300 mg of sodium per serving fit into this eating plan.  To find whole grains,  look for the word "whole" as the first word in the ingredient list. Shopping  Buy products labeled as "low-sodium" or "no salt added."  Buy fresh foods. Avoid canned foods and premade or frozen meals. Cooking  Avoid adding salt when cooking. Use salt-free seasonings or herbs instead of table salt or sea salt. Check with your health care provider or pharmacist before using salt substitutes.  Do not fry foods. Cook foods using healthy methods such as baking, boiling, grilling, and broiling instead.  Cook with heart-healthy oils, such as olive, canola, soybean, or sunflower oil. Meal planning   Eat a balanced diet that includes: ? 5 or more servings of fruits and vegetables each day. At each meal, try to fill half of your plate with fruits and vegetables. ? Up to 6-8 servings of whole grains each day. ? Less than 6 oz of lean meat, poultry, or fish each day. A 3-oz serving of meat is about the same size as a deck of cards. One egg equals 1 oz. ? 2 servings of low-fat dairy each day. ? A serving of nuts, seeds, or beans 5 times each week. ? Heart-healthy fats. Healthy fats called Omega-3 fatty acids are found in foods such as flaxseeds and coldwater fish, like sardines, salmon, and mackerel.  Limit how much you eat of the following: ? Canned or prepackaged foods. ? Food that is high in trans fat, such as fried foods. ? Food that is high in saturated fat, such as fatty meat. ? Sweets, desserts, sugary drinks, and other foods with added sugar. ? Full-fat  dairy products.  Do not salt foods before eating.  Try to eat at least 2 vegetarian meals each week.  Eat more home-cooked food and less restaurant, buffet, and fast food.  When eating at a restaurant, ask that your food be prepared with less salt or no salt, if possible. What foods are recommended? The items listed may not be a complete list. Talk with your dietitian about what dietary choices are best for you. Grains Whole-grain  or whole-wheat bread. Whole-grain or whole-wheat pasta. Brown rice. [place]. Bulgur. Whole-grain and low-sodium cereals. Pita bread. Low-fat, low-sodium crackers. Whole-wheat flour tortillas. Vegetables Fresh or frozen vegetables (raw, steamed, roasted, or grilled). Low-sodium or reduced-sodium tomato and vegetable juice. Low-sodium or reduced-sodium tomato sauce and tomato paste. Low-sodium or reduced-sodium canned vegetables. Fruits All fresh, dried, or frozen fruit. Canned fruit in natural juice (without added sugar). Meat and other protein foods Skinless chicken or [place]. Ground chicken or [place]. Pork with fat trimmed off. Fish and seafood. Egg whites. Dried beans, peas, or lentils. Unsalted nuts, nut butters, and seeds. Unsalted canned beans. Lean cuts of beef with fat trimmed off. Low-sodium, lean deli meat. Dairy Low-fat (1%) or fat-free (skim) milk. Fat-free, low-fat, or reduced-fat cheeses. Nonfat, low-sodium ricotta or cottage cheese. Low-fat or nonfat yogurt. Low-fat, low-sodium cheese. Fats and oils Soft margarine without trans fats. Vegetable oil. Low-fat, reduced-fat, or light mayonnaise and salad dressings (reduced-sodium). Canola, safflower, olive, soybean, and sunflower oils. Avocado. Seasoning and other foods Herbs. Spices. Seasoning mixes without salt. Unsalted popcorn and pretzels. Fat-free sweets. What foods are not recommended? The items listed may not be a complete list. Talk with your dietitian about what dietary choices are best for you. Grains Baked goods made with fat, such as croissants, muffins, or some breads. Dry pasta or rice meal packs. Vegetables Creamed or fried vegetables. Vegetables in a cheese sauce. Regular canned vegetables (not low-sodium or reduced-sodium). Regular canned tomato sauce and paste (not low-sodium or reduced-sodium). Regular tomato and vegetable juice (not low-sodium or reduced-sodium). Angie Fava. Olives. Fruits Canned fruit in a  light or heavy syrup. Fried fruit. Fruit in cream or butter sauce. Meat and other protein foods Fatty cuts of meat. Ribs. Fried meat. Berniece Salines. Sausage. Bologna and other processed lunch meats. Salami. Fatback. Hotdogs. Bratwurst. Salted nuts and seeds. Canned beans with added salt. Canned or smoked fish. Whole eggs or egg yolks. Chicken or [place] with skin. Dairy Whole or 2% milk, cream, and half-and-half. Whole or full-fat cream cheese. Whole-fat or sweetened yogurt. Full-fat cheese. Nondairy creamers. Whipped toppings. Processed cheese and cheese spreads. Fats and oils Butter. Stick margarine. Lard. Shortening. Ghee. Bacon fat. Tropical oils, such as coconut, palm kernel, or palm oil. Seasoning and other foods Salted popcorn and pretzels. Onion salt, garlic salt, seasoned salt, table salt, and sea salt. Worcestershire sauce. Tartar sauce. Barbecue sauce. Teriyaki sauce. Soy sauce, including reduced-sodium. Steak sauce. Canned and packaged gravies. Fish sauce. Oyster sauce. Cocktail sauce. Horseradish that you find on the shelf. Ketchup. Mustard. Meat flavorings and tenderizers. Bouillon cubes. Hot sauce and Tabasco sauce. Premade or packaged marinades. Premade or packaged taco seasonings. Relishes. Regular salad dressings. Where to find more information:  National Heart, Lung, and [place]: https://wilson-eaton.com/  American Heart Association: www.heart.org Summary  The DASH eating plan is a healthy eating plan that has been shown to reduce high blood pressure (hypertension). It may also reduce your risk for type 2 diabetes, heart disease, and stroke.  With the DASH eating plan, you should  limit salt (sodium) intake to 2,300 mg a day. If you have hypertension, you may need to reduce your sodium intake to 1,500 mg a day.  When on the DASH eating plan, aim to eat more fresh fruits and vegetables, whole grains, lean proteins, low-fat dairy, and heart-healthy fats.  Work with your health care  provider or diet and nutrition specialist (dietitian) to adjust your eating plan to your individual calorie needs. This information is not intended to replace advice given to you by your health care provider. Make sure you discuss any questions you have with your health care provider. Document Released: 11/24/2011 Document Revised: 11/28/2016 Document Reviewed: 11/28/2016 Elsevier Interactive Patient Education  [employer].   Please be sure medication list is accurate. If a new problem present, please set up appointment sooner than planned today.

## 2018-06-11 NOTE — Progress Notes (Signed)
ACUTE VISIT   HPI:  Chief Complaint  Patient presents with  . Chest Pain    noticed chest pain last night after eating, the pain turned into pressure and he noticed pain in the left arm, still has pressure now, no pain in the arm now     [name] is a 57 y.o. male, who is here today complaining of pressure chest sensation,mid chest towards right-sided chest. He has not identified exacerbating or alleviating factors.  He has not tried OTC treatment.  Intermittent for a couple of weeks but last night it was worse. It happened after dinner, associated bloating sensation, states that he "overate."   Not radiated to upper extremities or neck. No associated diaphoresis or palpitations. He felt some SOB but he thinks it was caused by anxiety.  Chest pain is not related with exertion. He went to bed with chest pain and still having some discomfort.   She was last seen in 02/2017 for elevated BP.  History of GERD and hyperlipidemia. He does not think chest pain is related to indigestion. He is concerned about cardiac etiology. He is on Zocor 20 mg daily.  Lab Results  Component Value Date   CHOL 287 (H) 06/27/2016   HDL 48.00 06/27/2016   LDLCALC 213 (H) 06/27/2016   LDLDIRECT 146.1 04/13/2010   TRIG 127.0 06/27/2016   CHOLHDL 6 06/27/2016   States that he is having "horroble indigestion." He takes Nexium occasionally,he is afraid of side effects.  Elevated BP. He is not checking BP at home. He has had intermittent elevated BP.   Review of Systems  Constitutional: Negative for activity change, appetite change, fatigue and fever.  HENT: Negative for nosebleeds, sore throat and trouble swallowing.   Eyes: Negative for redness and visual disturbance.  Respiratory: Positive for shortness of breath. Negative for cough and wheezing.   Cardiovascular: Positive for chest pain. Negative for palpitations and leg swelling.  Gastrointestinal: Negative for  abdominal pain, nausea and vomiting.  Genitourinary: Negative for decreased urine volume and hematuria.  Musculoskeletal: Negative for back pain and gait problem.  Skin: Negative for pallor and rash.  Neurological: Negative for dizziness, syncope, weakness and headaches.  Psychiatric/Behavioral: Negative for confusion. The patient is nervous/anxious.       Current Outpatient Medications on File Prior to Visit  Medication Sig Dispense Refill  . esomeprazole (NEXIUM) 40 MG capsule Take 40 mg by mouth daily as needed.    . simvastatin (ZOCOR) 20 MG tablet TAKE 1 TABLET (20 MG TOTAL) BY MOUTH AT BEDTIME. 90 tablet 1  . albuterol (PROVENTIL HFA;VENTOLIN HFA) 108 (90 Base) MCG/ACT inhaler Inhale 2 puffs into the lungs every 6 (six) hours as needed for wheezing or shortness of breath. (Patient not taking: Reported on 06/11/2018) 1 Inhaler 0   No current facility-administered medications on file prior to visit.      Past Medical History:  Diagnosis Date  . GERD (gastroesophageal reflux disease)   . Hyperlipidemia    No Known Allergies  Social History   Socioeconomic History  . Marital status: Married    Spouse name: Not on file  . Number of children: Not on file  . Years of education: Not on file  . Highest education level: Not on file  Occupational History  . Occupation: Armed forces operational officer  Social Needs  . Financial resource strain: Not on file  . Food insecurity:    Worry: Not on file    Inability: Not  on file  . Transportation needs:    Medical: Not on file    Non-medical: Not on file  Tobacco Use  . Smoking status: Former Research scientist (life sciences)  . Smokeless tobacco: Never Used  Substance and Sexual Activity  . Alcohol use: No  . Drug use: No  . Sexual activity: Not on file  Lifestyle  . Physical activity:    Days per week: Not on file    Minutes per session: Not on file  . Stress: Not on file  Relationships  . Social connections:    Talks on phone: Not on file    Gets together: Not  on file    Attends religious service: Not on file    Active member of club or organization: Not on file    Attends meetings of clubs or organizations: Not on file    Relationship status: Not on file  Other Topics Concern  . Not on file  Social History Narrative  . Not on file    Vitals:   06/11/18 1559  BP: (!) 144/92  Pulse: 83  Resp: 12  Temp: 98.7 F (37.1 C)  SpO2: 98%   Body mass index is 27.78 kg/m.   Physical Exam  Nursing note and vitals reviewed. Constitutional: He is oriented to person, place, and time. He appears well-developed and well-nourished. No distress.  HENT:  Head: Normocephalic and atraumatic.  Mouth/Throat: Oropharynx is clear and moist and mucous membranes are normal.  Eyes: Pupils are equal, round, and reactive to light. Conjunctivae are normal.  Cardiovascular: Normal rate and regular rhythm.  No murmur heard. Pulses:      Dorsalis pedis pulses are 2+ on the right side, and 2+ on the left side.  Respiratory: Effort normal and breath sounds normal. No respiratory distress. He exhibits no tenderness.  GI: Soft. He exhibits no mass. There is no hepatomegaly. There is tenderness in the epigastric area. There is no rigidity and no guarding.  Musculoskeletal: He exhibits no edema.  Lymphadenopathy:    He has no cervical adenopathy.  Neurological: He is alert and oriented to person, place, and time. He has normal strength.  Skin: Skin is warm. No erythema.  Psychiatric: His mood appears anxious.  Well groomed, good eye contact.      ASSESSMENT AND PLAN:  Mr. Kyle Palmer was seen today for chest pain.  Orders Placed This Encounter  Procedures  . Lipid panel  . Comprehensive metabolic panel  . Ambulatory referral to Cardiology  . EKG 12-Lead     Chest tightness or pressure  Possible etiologies discussed. HTN,HLD,and age are some risk factors but Hx do not suggest cardiac etiology. Clearly instructed about warning signs. EKG today: Normal  SR,axis,and intervals. No signs of ischemia. No recent EKG for comparison, EKG in 1996 no significant changes. Cardiology referral placed.   Benign essential hypertension Not well controlled. Possible complications of elevated BP discussed. DASH-low salt diet recommended. Monitor BP at home. He is not interested in antihypertensive medication. F/U in 3 months.    Hyperlipidemia No changes in current management, will follow labs done today and will give further recommendations accordingly.   GERD I think this problem is causing chest pain. PPI side effects discussed as well as effects of GERD on esophagus, Recommend Nexium 40 mg for 3-4 weeks daily then we can try to decrease dose or take prn. GERD precautions also recommended. F/U in 3 months.     Return in about 3 months (around 09/11/2018) for CPE.  Betty G. Martinique, MD  [facility]. [place] office.

## 2018-06-11 NOTE — Assessment & Plan Note (Signed)
No changes in current management, will follow labs done today and will give further recommendations accordingly.  

## 2018-06-11 NOTE — Assessment & Plan Note (Signed)
Not well controlled. Possible complications of elevated BP discussed. DASH-low salt diet recommended. Monitor BP at home. He is not interested in antihypertensive medication. F/U in 3 months.

## 2018-06-11 NOTE — Assessment & Plan Note (Signed)
I think this problem is causing chest pain. PPI side effects discussed as well as effects of GERD on esophagus, Recommend Nexium 40 mg for 3-4 weeks daily then we can try to decrease dose or take prn. GERD precautions also recommended. F/U in 3 months.

## 2018-06-12 LAB — COMPREHENSIVE METABOLIC PANEL
ALT: 25 U/L (ref 0–53)
AST: 21 U/L (ref 0–37)
Albumin: 4.9 g/dL (ref 3.5–5.2)
Alkaline Phosphatase: 81 U/L (ref 39–117)
BUN: 12 mg/dL (ref 6–23)
CO2: 30 mEq/L (ref 19–32)
Calcium: 10.1 mg/dL (ref 8.4–10.5)
Chloride: 99 mEq/L (ref 96–112)
Creatinine, Ser: 1.02 mg/dL (ref 0.40–1.50)
GFR: 79.59 mL/min (ref >60.00)
Glucose, Bld: 101 mg/dL — ABNORMAL HIGH (ref 70–99)
Potassium: 3.9 mEq/L (ref 3.5–5.1)
Sodium: 138 mEq/L (ref 135–145)
Total Bilirubin: 0.6 mg/dL (ref 0.2–1.2)
Total Protein: 7.5 g/dL (ref 6.0–8.3)

## 2018-06-12 LAB — LIPID PANEL
Cholesterol: 219 mg/dL — ABNORMAL HIGH (ref 0–200)
HDL: 52.1 mg/dL (ref >39.00)
LDL Cholesterol: 135 mg/dL — ABNORMAL HIGH (ref 0–99)
NonHDL: 167.14
Total CHOL/HDL Ratio: 4
Triglycerides: 163 mg/dL — ABNORMAL HIGH (ref 0.0–149.0)
VLDL: 32.6 mg/dL (ref 0.0–40.0)

## 2018-06-22 ENCOUNTER — Other Ambulatory Visit: Payer: Self-pay | Admitting: Family Medicine

## 2018-06-22 DIAGNOSIS — E785 Hyperlipidemia, unspecified: Secondary | ICD-10-CM

## 2018-06-22 MED ORDER — SIMVASTATIN 40 MG PO TABS: 40.0000 mg | ORAL_TABLET | Freq: Every day | ORAL | 2 refills | Status: DC

## 2018-07-17 ENCOUNTER — Other Ambulatory Visit: Payer: Self-pay | Admitting: Family Medicine

## 2018-07-17 DIAGNOSIS — E785 Hyperlipidemia, unspecified: Secondary | ICD-10-CM

## 2018-10-25 ENCOUNTER — Ambulatory Visit: Payer: Self-pay

## 2018-10-25 ENCOUNTER — Ambulatory Visit (INDEPENDENT_AMBULATORY_CARE_PROVIDER_SITE_OTHER): Payer: BLUE CROSS/BLUE SHIELD | Admitting: *Deleted

## 2018-10-25 DIAGNOSIS — Z23 Encounter for immunization: Secondary | ICD-10-CM | POA: Diagnosis not present

## 2018-10-25 NOTE — Telephone Encounter (Signed)
Returned call to patient. VM left to call office.

## 2018-10-25 NOTE — Progress Notes (Signed)
Per orders of Dr. Martinique, injection of influenza and tdap given by Westley Hummer. Patient tolerated injection well.

## 2018-10-25 NOTE — Telephone Encounter (Signed)
Patient returned call to [place], appointment scheduled for today, 10/25/18 at 1700 for Tetanus.

## 2018-10-25 NOTE — Telephone Encounter (Signed)
Noted in completing documentation that last Tetanus shot was on 11/03/2006.  Attempted to return call to pt. Left voice message to call the office re: needing to schedule appt. Today for a Tetanus shot.

## 2018-10-25 NOTE — Telephone Encounter (Signed)
Pt. Reported he stepped on a nail yesterday afternoon.  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  Reported the nail went into the ball of right foot approx. 1/4-3/8 ".  Stated he was able to keep from stepping fully on the nail, and feels it did not go in as deep as the bone.  Reported he was told he rec'd last Tetanus in 2017.  Denied any redness, warmth, drainage, or swelling @ puncture site.  Stated when he took a shower this AM, he washed the site.  Denied any application of antibiotic oint. or bandaid.  Stated "I don't think I need to come to the office, I just wanted to check on my Tetanus.  [place] advice.  Encouraged to keep site clean, apply antibiotic oint. And bandaid, and closely monitor for any signs of infection.  Verb. Understanding.  Agrees with plan.          Reason for Disposition . [1] Last tetanus shot > 5 years ago AND [2] DIRTY puncture (e.g., object OR skin was dirty, objects on ground/floor)  Answer Assessment - Initial Assessment Questions 1. LOCATION: "Where is the puncture located?"      Ball of right foot 2. OBJECT: "What was the object that punctured the skin?"     nail 3. DEPTH: "How deep do you think the puncture goes?"      Approx. 1/4 "  4. ONSET: "When did the injury occur?" (Minutes or hours)    Yesterday afternoon  5. PAIN: "Is it painful?" If so, ask: "How bad is the pain?"  (Scale 1-10; or mild, moderate, severe)    Soreness; no redness, swelling; no fever/ chills, no drainage.   6. TETANUS: "When was the last tetanus booster?"     Tetanus shot in 2017  Protocols used: [place]

## 2018-10-25 NOTE — Telephone Encounter (Signed)
Message from Sheran Luz sent at 10/25/2018 8:59 AM EST   Summary: stepped on nail-tetanus shot   Patient is requesting call back from nurse to discuss whether he should get a tetanus shot stating he stepped on a nail at work. Please advise.

## 2018-11-19 ENCOUNTER — Encounter: Payer: BLUE CROSS/BLUE SHIELD | Admitting: Family Medicine

## 2018-11-22 ENCOUNTER — Encounter: Payer: Self-pay | Admitting: Family Medicine

## 2018-11-22 ENCOUNTER — Ambulatory Visit (INDEPENDENT_AMBULATORY_CARE_PROVIDER_SITE_OTHER): Payer: BLUE CROSS/BLUE SHIELD | Admitting: Family Medicine

## 2018-11-22 VITALS — BP 124/84 | HR 80 | Temp 98.2°F | Wt 195.7 lb

## 2018-11-22 DIAGNOSIS — R030 Elevated blood-pressure reading, without diagnosis of hypertension: Secondary | ICD-10-CM | POA: Diagnosis not present

## 2018-11-22 DIAGNOSIS — E785 Hyperlipidemia, unspecified: Secondary | ICD-10-CM | POA: Diagnosis not present

## 2018-11-22 DIAGNOSIS — N401 Enlarged prostate with lower urinary tract symptoms: Secondary | ICD-10-CM

## 2018-11-22 DIAGNOSIS — R350 Frequency of micturition: Secondary | ICD-10-CM

## 2018-11-22 DIAGNOSIS — Z125 Encounter for screening for malignant neoplasm of prostate: Secondary | ICD-10-CM

## 2018-11-22 DIAGNOSIS — I10 Essential (primary) hypertension: Secondary | ICD-10-CM | POA: Diagnosis not present

## 2018-11-22 DIAGNOSIS — K219 Gastro-esophageal reflux disease without esophagitis: Secondary | ICD-10-CM | POA: Diagnosis not present

## 2018-11-22 LAB — CBC WITH DIFFERENTIAL/PLATELET
Basophils Absolute: 0.1 10*3/uL (ref 0.0–0.1)
Basophils Relative: 1 % (ref 0.0–3.0)
Eosinophils Absolute: 0.5 10*3/uL (ref 0.0–0.7)
Eosinophils Relative: 6.5 % — ABNORMAL HIGH (ref 0.0–5.0)
HCT: 43.6 % (ref 39.0–52.0)
Hemoglobin: 15.3 g/dL (ref 13.0–17.0)
Lymphocytes Relative: 28 % (ref 12.0–46.0)
Lymphs Abs: 2 10*3/uL (ref 0.7–4.0)
MCHC: 35.2 g/dL (ref 30.0–36.0)
MCV: 98.9 fl (ref 78.0–100.0)
Monocytes Absolute: 0.9 10*3/uL (ref 0.1–1.0)
Monocytes Relative: 12.7 % — ABNORMAL HIGH (ref 3.0–12.0)
Neutro Abs: 3.6 10*3/uL (ref 1.4–7.7)
Neutrophils Relative %: 51.8 % (ref 43.0–77.0)
Platelets: 335 10*3/uL (ref 150.0–400.0)
RBC: 4.4 Mil/uL (ref 4.22–5.81)
RDW: 13.4 % (ref 11.5–15.5)
WBC: 7 10*3/uL (ref 4.0–10.5)

## 2018-11-22 LAB — COMPREHENSIVE METABOLIC PANEL
ALT: 34 U/L (ref 0–53)
AST: 29 U/L (ref 0–37)
Albumin: 4.9 g/dL (ref 3.5–5.2)
Alkaline Phosphatase: 95 U/L (ref 39–117)
BUN: 11 mg/dL (ref 6–23)
CO2: 31 mEq/L (ref 19–32)
Calcium: 10.3 mg/dL (ref 8.4–10.5)
Chloride: 101 mEq/L (ref 96–112)
Creatinine, Ser: 1.09 mg/dL (ref 0.40–1.50)
GFR: 73.61 mL/min (ref >60.00)
Glucose, Bld: 95 mg/dL (ref 70–99)
Potassium: 5.1 mEq/L (ref 3.5–5.1)
Sodium: 140 mEq/L (ref 135–145)
Total Bilirubin: 0.3 mg/dL (ref 0.2–1.2)
Total Protein: 7.7 g/dL (ref 6.0–8.3)

## 2018-11-22 LAB — POCT URINALYSIS DIPSTICK
Bilirubin, UA: NEGATIVE
Blood, UA: NEGATIVE
Glucose, UA: NEGATIVE
Ketones, UA: NEGATIVE
Leukocytes, UA: NEGATIVE
Nitrite, UA: NEGATIVE
Protein, UA: NEGATIVE
Spec Grav, UA: 1.025 (ref 1.010–1.025)
Urobilinogen, UA: 0.2 E.U./dL
pH, UA: 6.5 (ref 5.0–8.0)

## 2018-11-22 LAB — LIPID PANEL
Cholesterol: 193 mg/dL (ref 0–200)
HDL: 55.7 mg/dL (ref >39.00)
LDL Cholesterol: 123 mg/dL — ABNORMAL HIGH (ref 0–99)
NonHDL: 137.51
Total CHOL/HDL Ratio: 3
Triglycerides: 71 mg/dL (ref 0.0–149.0)
VLDL: 14.2 mg/dL (ref 0.0–40.0)

## 2018-11-22 LAB — TSH: TSH: 1.44 u[IU]/mL (ref 0.35–4.50)

## 2018-11-22 LAB — PSA: PSA: 0.42 ng/mL (ref 0.10–4.00)

## 2018-11-22 NOTE — Progress Notes (Signed)
Kyle Palmer DOB: November 30, 1961 Encounter date: 11/22/2018  This isa 57 y.o. male who presents to establish care. Chief Complaint  Patient presents with  . Transitions Of Care    will schedule seperate app for physical    History of present illness: Has been a couple of years since he has had a physical. Has a few connections that have been diagnosed with cancer so this worries him. Not getting enough exercise and does a lot of traveling. Eats fairly well overall.   Has increased simvastatin to 40mg  and is taking regularly. No problems with medication.   Blood pressure has been elevating, but he is pleased that it is better today. Hasn't checked at home.   Has colonoscopy scheduled in January. Goes regularly because of mother having colon cancer.   Energy level is pretty good.   Past Medical History:  Diagnosis Date  . GERD (gastroesophageal reflux disease)   . Hyperlipidemia    No past surgical history on file. No Known Allergies Current Meds  Medication Sig  . albuterol (PROVENTIL HFA;VENTOLIN HFA) 108 (90 Base) MCG/ACT inhaler Inhale 2 puffs into the lungs every 6 (six) hours as needed for wheezing or shortness of breath.  . esomeprazole (NEXIUM) 40 MG capsule Take 40 mg by mouth daily as needed.  . simvastatin (ZOCOR) 40 MG tablet Take 1 tablet (40 mg total) by mouth at bedtime.   Social History   Tobacco Use  . Smoking status: Former Research scientist (life sciences)  . Smokeless tobacco: Never Used  Substance Use Topics  . Alcohol use: No   Family History  Problem Relation Age of Onset  . Colon cancer Mother 42  . Hypertension Father   . Parkinson's disease Father   . High Cholesterol Father   . Other Father        pancreatic tumor; not cancer  . Cancer Maternal Grandmother 48  . Cancer Maternal Grandfather 90  . Pancreatic cancer Paternal Grandfather      Review of Systems  Constitutional: Negative for chills, fatigue and fever.  Respiratory: Negative for cough, chest  tightness, shortness of breath and wheezing.   Cardiovascular: Negative for chest pain, palpitations and leg swelling.  Gastrointestinal: Negative for abdominal pain, constipation, diarrhea and nausea.  Psychiatric/Behavioral: Negative for sleep disturbance. Nervous/anxious: job can be higher intensity at times, but really enjoys his work.     Objective:  BP 124/84 (BP Location: Left Arm, Patient Position: Sitting, Cuff Size: Normal)   Pulse 80   Temp 98.2 F (36.8 C) (Oral)   Wt 195 lb 11.2 oz (88.8 kg)   SpO2 99%   BMI 27.68 kg/m   Weight: 195 lb 11.2 oz (88.8 kg)   BP Readings from Last 3 Encounters:  11/22/18 124/84  06/11/18 (!) 144/92  03/06/17 130/80   Wt Readings from Last 3 Encounters:  11/22/18 195 lb 11.2 oz (88.8 kg)  06/11/18 196 lb 6.4 oz (89.1 kg)  03/06/17 194 lb 6 oz (88.2 kg)    Physical Exam  Constitutional: He is oriented to person, place, and time. He appears well-developed and well-nourished. No distress.  Cardiovascular: Normal rate, regular rhythm and normal heart sounds. Exam reveals no friction rub.  No murmur heard. No lower extremity edema  Pulmonary/Chest: Effort normal and breath sounds normal. No respiratory distress. He has no wheezes. He has no rales.  Abdominal: Normal appearance and bowel sounds are normal. He exhibits no mass. There is no guarding.  Neurological: He is alert and oriented to person,  place, and time.  Psychiatric: His behavior is normal. Cognition and memory are normal.    Assessment/Plan:  1. Gastroesophageal reflux disease without esophagitis Continue with Nexium.  2. Benign prostatic hyperplasia with urinary frequency This was historical diagnosis.  He does note some increase in frequency during the day.  Urinates twice at night.  Will check UA to make sure no underlying infection.  3. Urinary frequency See Above - POC Urinalysis Dipstick  4. Hyperlipidemia, unspecified hyperlipidemia type Taking Zocor 40 mg. -  Lipid panel; Future - TSH; Future - TSH - Lipid panel  5. Prostate cancer screening Discussed pros and cons of screening.  He prefers to have any cancer screening blood work available. - PSA  6. Elevated blood pressure, situational Check blood pressure at home regularly.  He will send me a MyChart message with readings in a couple of weeks and we can also review at upcoming appointment. - Comprehensive metabolic panel; Future - Comprehensive metabolic panel   - CBC with Differential/Platelet; Future - CBC with Differential/Platelet   Return when able; 1 month, for physical exam.  Micheline Rough, MD

## 2018-11-22 NOTE — Patient Instructions (Signed)
Check blood pressures at home. Let me know if running 140/90 or higher on regular basis when checking.

## 2018-11-23 ENCOUNTER — Telehealth: Payer: Self-pay | Admitting: Family Medicine

## 2018-11-23 NOTE — Telephone Encounter (Signed)
Copied from [place] [id number]. Topic: Quick Communication - Lab Results (Clinic Use ONLY) >> Nov 22, 2018  5:08 PM Rebecca Eaton, CMA wrote: Called patient to inform them of 11/22/18 lab results. When patient returns call, triage nurse may disclose results.   [phone]

## 2018-11-23 NOTE — Telephone Encounter (Signed)
Charted in result notes. 

## 2018-11-26 ENCOUNTER — Encounter: Payer: BLUE CROSS/BLUE SHIELD | Admitting: Family Medicine

## 2018-11-28 ENCOUNTER — Other Ambulatory Visit: Payer: Self-pay | Admitting: Dermatology

## 2018-12-23 NOTE — Progress Notes (Signed)
Kyle Palmer DOB: 1961-03-29 Encounter date: 12/24/2018  This is a 58 y.o. male who presents for complete physical   History of present illness/Additional concerns: Has not been checking blood pressures at home. Has been off work for 2 weeks, so feels like he has been pretty relaxed.   Has colonoscopy scheduled March 5th.  Has been eating healthier and doing more trail walking in last couple of weeks. Plans to keep up exercise. Plans to keep up with healthier eating.   We reviewed bloodwork together in the office today.    Past Medical History:  Diagnosis Date  . GERD (gastroesophageal reflux disease)   . Hyperlipidemia    No past surgical history on file. No Known Allergies Current Meds  Medication Sig  . albuterol (PROVENTIL HFA;VENTOLIN HFA) 108 (90 Base) MCG/ACT inhaler Inhale 2 puffs into the lungs every 6 (six) hours as needed for wheezing or shortness of breath.  . esomeprazole (NEXIUM) 40 MG capsule Take 40 mg by mouth daily as needed.  . simvastatin (ZOCOR) 40 MG tablet Take 1 tablet (40 mg total) by mouth at bedtime.   Social History   Tobacco Use  . Smoking status: Former Research scientist (life sciences)  . Smokeless tobacco: Never Used  Substance Use Topics  . Alcohol use: No   Family History  Problem Relation Age of Onset  . Colon cancer Mother 69  . Hypertension Father   . Parkinson's disease Father   . High Cholesterol Father   . Other Father        pancreatic tumor; not cancer  . Cancer Maternal Grandmother 57  . Cancer Maternal Grandfather 90  . Pancreatic cancer Paternal Grandfather      Review of Systems  Constitutional: Negative for activity change, appetite change, chills, fatigue, fever and unexpected weight change.  HENT: Negative for congestion, ear pain, hearing loss, sinus pressure, sinus pain, sore throat and trouble swallowing.   Eyes: Negative for pain and visual disturbance.  Respiratory: Negative for cough, chest tightness, shortness of breath and  wheezing.   Cardiovascular: Negative for chest pain, palpitations and leg swelling.  Gastrointestinal: Negative for abdominal distention, abdominal pain, blood in stool, constipation, diarrhea, nausea and vomiting.  Genitourinary: Negative for decreased urine volume, difficulty urinating, dysuria, penile pain and testicular pain.  Musculoskeletal: Negative for arthralgias, back pain and joint swelling.  Skin: Negative for rash.  Neurological: Negative for dizziness, weakness, numbness and headaches.  Hematological: Negative for adenopathy. Does not bruise/bleed easily.  Psychiatric/Behavioral: Negative for agitation, sleep disturbance and suicidal ideas. The patient is not nervous/anxious.     CBC:  Lab Results  Component Value Date   WBC 7.0 11/22/2018   HGB 15.3 11/22/2018   HCT 43.6 11/22/2018   MCHC 35.2 11/22/2018   RDW 13.4 11/22/2018   PLT 335.0 11/22/2018   CMP: Lab Results  Component Value Date   NA 140 11/22/2018   K 5.1 11/22/2018   CL 101 11/22/2018   CO2 31 11/22/2018   GLUCOSE 95 11/22/2018   BUN 11 11/22/2018   CREATININE 1.09 11/22/2018   CALCIUM 10.3 11/22/2018   PROT 7.7 11/22/2018   BILITOT 0.3 11/22/2018   ALKPHOS 95 11/22/2018   ALT 34 11/22/2018   AST 29 11/22/2018   LIPID: Lab Results  Component Value Date   CHOL 193 11/22/2018   TRIG 71.0 11/22/2018   HDL 55.70 11/22/2018   LDLCALC 123 (H) 11/22/2018    Objective:  BP 120/64 (BP Location: Left Arm, Patient Position: Sitting, Cuff  Size: Normal)   Pulse 75   Temp 98.3 F (36.8 C) (Oral)   Ht 5\' 11"  (1.803 m)   Wt 188 lb 6.4 oz (85.5 kg)   SpO2 99%   BMI 26.28 kg/m   Weight: 188 lb 6.4 oz (85.5 kg)   BP Readings from Last 3 Encounters:  12/24/18 120/64  11/22/18 124/84  06/11/18 (!) 144/92   Wt Readings from Last 3 Encounters:  12/24/18 188 lb 6.4 oz (85.5 kg)  11/22/18 195 lb 11.2 oz (88.8 kg)  06/11/18 196 lb 6.4 oz (89.1 kg)    Physical Exam Constitutional:      General:  He is not in acute distress.    Appearance: He is well-developed.  HENT:     Head: Normocephalic and atraumatic.     Right Ear: External ear normal.     Left Ear: External ear normal.     Nose: Nose normal.     Mouth/Throat:     Pharynx: No oropharyngeal exudate.  Eyes:     Conjunctiva/sclera: Conjunctivae normal.     Pupils: Pupils are equal, round, and reactive to light.  Neck:     Musculoskeletal: Neck supple.     Thyroid: No thyromegaly.  Cardiovascular:     Rate and Rhythm: Normal rate and regular rhythm.     Heart sounds: Normal heart sounds. No murmur. No friction rub. No gallop.   Pulmonary:     Effort: Pulmonary effort is normal. No respiratory distress.     Breath sounds: Normal breath sounds. No stridor. No wheezing or rales.  Abdominal:     General: Bowel sounds are normal.     Palpations: Abdomen is soft.  Musculoskeletal: Normal range of motion.  Skin:    General: Skin is warm and dry.  Neurological:     Mental Status: He is alert and oriented to person, place, and time.  Psychiatric:        Behavior: Behavior normal.        Thought Content: Thought content normal.        Judgment: Judgment normal.     Assessment/Plan: Health Maintenance Due  Topic Date Due  . Hepatitis C Screening  03-25-1961  . HIV Screening  12/10/1974   Health Maintenance reviewed.  1. Preventative health care Keep up with exercise and healthier eating. Keep check on blood pressure occasionally. Keep eye on weight (if losing unintentionally)  2. Hyperlipidemia, unspecified hyperlipidemia type Continue zocor.     Return in about 1 year (around 12/25/2019) for physical exam.  Micheline Rough, MD    OK to return for Shingrix if desired.

## 2018-12-24 ENCOUNTER — Ambulatory Visit (INDEPENDENT_AMBULATORY_CARE_PROVIDER_SITE_OTHER): Payer: Self-pay | Admitting: Family Medicine

## 2018-12-24 ENCOUNTER — Encounter: Payer: Self-pay | Admitting: Family Medicine

## 2018-12-24 VITALS — BP 120/64 | HR 75 | Temp 98.3°F | Ht 71.0 in | Wt 188.4 lb

## 2018-12-24 DIAGNOSIS — E785 Hyperlipidemia, unspecified: Secondary | ICD-10-CM

## 2018-12-24 DIAGNOSIS — Z0000 Encounter for general adult medical examination without abnormal findings: Secondary | ICD-10-CM

## 2019-03-03 DIAGNOSIS — Z86010 Personal history of colonic polyps: Secondary | ICD-10-CM | POA: Insufficient documentation

## 2019-03-03 DIAGNOSIS — Z800 Family history of malignant neoplasm of digestive organs: Secondary | ICD-10-CM | POA: Insufficient documentation

## 2019-05-04 ENCOUNTER — Other Ambulatory Visit: Payer: Self-pay | Admitting: Family Medicine

## 2019-05-04 DIAGNOSIS — E785 Hyperlipidemia, unspecified: Secondary | ICD-10-CM

## 2019-08-08 ENCOUNTER — Other Ambulatory Visit: Payer: Self-pay | Admitting: Family Medicine

## 2019-08-08 DIAGNOSIS — E785 Hyperlipidemia, unspecified: Secondary | ICD-10-CM

## 2019-10-28 ENCOUNTER — Ambulatory Visit: Payer: Self-pay

## 2019-12-04 ENCOUNTER — Other Ambulatory Visit: Payer: Self-pay | Admitting: Family Medicine

## 2019-12-04 DIAGNOSIS — E785 Hyperlipidemia, unspecified: Secondary | ICD-10-CM

## 2019-12-31 ENCOUNTER — Other Ambulatory Visit: Payer: Self-pay | Admitting: Family Medicine

## 2019-12-31 ENCOUNTER — Encounter: Payer: Self-pay | Admitting: Family Medicine

## 2019-12-31 ENCOUNTER — Telehealth: Payer: Self-pay | Admitting: *Deleted

## 2019-12-31 DIAGNOSIS — R748 Abnormal levels of other serum enzymes: Secondary | ICD-10-CM

## 2019-12-31 NOTE — Telephone Encounter (Signed)
Spoke with the pt and he denies any abdominal discomfort.  Appt for the physical exam scheduled in a virtual spot on 2/5 to arrive at 8:45am.

## 2019-12-31 NOTE — Telephone Encounter (Signed)
-----   Message from Caren Macadam, MD sent at 12/31/2019  3:59 PM EST ----- Please schedule patient for physical.  Last visit was January 2020.  He had concerns about liver enzyme elevation and I sent in a long message back regarding this in my chart.  If he is not having any abdominal symptoms, he could schedule his physical as soon as a couple of weeks away and we can recheck blood work at that time to follow-up on liver enzymes.

## 2020-01-20 ENCOUNTER — Other Ambulatory Visit: Payer: Self-pay

## 2020-01-20 ENCOUNTER — Other Ambulatory Visit (INDEPENDENT_AMBULATORY_CARE_PROVIDER_SITE_OTHER): Payer: 59

## 2020-01-20 DIAGNOSIS — R748 Abnormal levels of other serum enzymes: Secondary | ICD-10-CM

## 2020-01-20 LAB — COMPREHENSIVE METABOLIC PANEL
ALT: 22 U/L (ref 0–53)
AST: 19 U/L (ref 0–37)
Albumin: 4.7 g/dL (ref 3.5–5.2)
Alkaline Phosphatase: 74 U/L (ref 39–117)
BUN: 16 mg/dL (ref 6–23)
CO2: 30 mEq/L (ref 19–32)
Calcium: 9.9 mg/dL (ref 8.4–10.5)
Chloride: 100 mEq/L (ref 96–112)
Creatinine, Ser: 1.02 mg/dL (ref 0.40–1.50)
GFR: 74.47 mL/min (ref >60.00)
Glucose, Bld: 92 mg/dL (ref 70–99)
Potassium: 4.4 mEq/L (ref 3.5–5.1)
Sodium: 137 mEq/L (ref 135–145)
Total Bilirubin: 0.4 mg/dL (ref 0.2–1.2)
Total Protein: 7.2 g/dL (ref 6.0–8.3)

## 2020-01-20 LAB — GAMMA GT: GGT: 48 U/L (ref 7–51)

## 2020-01-21 ENCOUNTER — Encounter: Payer: Self-pay | Admitting: Family Medicine

## 2020-01-21 LAB — HEPATITIS C ANTIBODY
Hepatitis C Ab: NONREACTIVE
SIGNAL TO CUT-OFF: 0.01 (ref <1.00)

## 2020-01-21 NOTE — Telephone Encounter (Signed)
I don't think this will be possible, but is lab able to add a lipid panel to bloodwork?

## 2020-01-22 ENCOUNTER — Other Ambulatory Visit (INDEPENDENT_AMBULATORY_CARE_PROVIDER_SITE_OTHER): Payer: 59

## 2020-01-22 DIAGNOSIS — E785 Hyperlipidemia, unspecified: Secondary | ICD-10-CM | POA: Diagnosis not present

## 2020-01-22 LAB — LIPID PANEL
Cholesterol: 149 mg/dL (ref 0–200)
HDL: 41.3 mg/dL (ref >39.00)
LDL Cholesterol: 88 mg/dL (ref 0–99)
NonHDL: 107.57
Total CHOL/HDL Ratio: 4
Triglycerides: 100 mg/dL (ref 0.0–149.0)
VLDL: 20 mg/dL (ref 0.0–40.0)

## 2020-01-22 NOTE — Telephone Encounter (Signed)
Per Margarita Grizzle at the [facility], a lipid panel can be added.  I completed the add on sheet and faxed to [phone].  Message sent to PCP.

## 2020-01-24 ENCOUNTER — Ambulatory Visit: Payer: Self-pay | Admitting: Family Medicine

## 2020-01-24 NOTE — Progress Notes (Signed)
No show

## 2020-01-27 NOTE — Telephone Encounter (Signed)
Can be rescheduled in any 30 min slot. If nothing available in next few weeks let me know

## 2020-02-12 ENCOUNTER — Ambulatory Visit: Payer: 59 | Admitting: Family Medicine

## 2020-03-02 ENCOUNTER — Other Ambulatory Visit: Payer: Self-pay | Admitting: Family Medicine

## 2020-03-02 DIAGNOSIS — E785 Hyperlipidemia, unspecified: Secondary | ICD-10-CM

## 2020-03-06 ENCOUNTER — Other Ambulatory Visit: Payer: Self-pay

## 2020-03-06 ENCOUNTER — Encounter: Payer: Self-pay | Admitting: Dermatology

## 2020-03-06 ENCOUNTER — Ambulatory Visit: Payer: 59 | Admitting: Dermatology

## 2020-03-06 DIAGNOSIS — L570 Actinic keratosis: Secondary | ICD-10-CM

## 2020-03-06 DIAGNOSIS — D0439 Carcinoma in situ of skin of other parts of face: Secondary | ICD-10-CM | POA: Diagnosis not present

## 2020-03-06 DIAGNOSIS — D492 Neoplasm of unspecified behavior of bone, soft tissue, and skin: Secondary | ICD-10-CM

## 2020-03-06 DIAGNOSIS — C4492 Squamous cell carcinoma of skin, unspecified: Secondary | ICD-10-CM

## 2020-03-06 HISTORY — DX: Squamous cell carcinoma of skin, unspecified: C44.92

## 2020-03-06 NOTE — Progress Notes (Addendum)
   Follow-Up Visit   Subjective  Kyle Palmer is a 59 y.o. male who presents for the following: Skin Problem (Patient, last seen in Dec 2019, returns to the office because he has noticed a change on the place on his right forehead. Noticed the change about 2 months.  Flaking but no bleeding.).  Growth Location: Forehead Duration: 1+ year Quality: Increased size Associated Signs/Symptoms: Modifying Factors:  Severity:  Timing: Context:   The following portions of the chart were reviewed this encounter and updated as appropriate:     Objective  Well appearing patient in no apparent distress; mood and affect are within normal limits. Patient's chief concern is change in longstanding lesion on his right forehead.  This has grown and become symptomatic.  Examination showed a hornlike 5 mm lesion which could be a hypertrophic actinic keratosis or superficial squamous cell carcinoma.  Shave biopsy obtained.  Instructions for care reviewed with patient and he understands there is a small risk risk of a scar.  Just lateral to this is a similar 2 mm lesion which represents an actinic keratosis; this was treated with liquid nitrogen freeze.  The patient can check on the biopsy results on Tuesday on my chart and he will call me with any questions relating to that additional waist up examination performed.  There were no atypical moles.  On the right outer scapula is a stuck on 3 mm seborrheic keratosis.  There is a small skin tag on the left posterior neck.  On his face, cheeks, nose are a dozen 2 to 3 mm umbilicated pink papules typical of sebaceous hyperplasia; he was told that if any of these were to grow or bleed he should return. All skin waist up examined.  Objective  Right Forehead: Erythematous patches with gritty scale.  Objective  Right Forehead: 20mm white crust, rule out CIS     Assessment & Plan  AK (actinic keratosis) Right Forehead  Destruction of lesion - Right  Forehead  Destruction method: cryotherapy   Informed consent: discussed and consent obtained   Lesion destroyed using liquid nitrogen: Yes   Region frozen until ice ball extended beyond lesion: Yes   Cryotherapy cycles:  3 Outcome: patient tolerated procedure well with no complications   Additional details:  5 sec LN2  Neoplasm of skin Right Forehead  Skin / nail biopsy Type of biopsy: tangential   Informed consent: discussed and consent obtained   Timeout: patient name, date of birth, surgical site, and procedure verified   Procedure prep:  Patient was prepped and draped in usual sterile fashion Prep type:  Chlorhexidine Anesthesia: the lesion was anesthetized in a standard fashion   Anesthetic:  1% lidocaine w/ epinephrine 1-100,000 local infiltration Instrument used: flexible razor blade   Hemostasis achieved with: ferric subsulfate   Outcome: patient tolerated procedure well   Post-procedure details: sterile dressing applied and wound care instructions given   Dressing type: petrolatum   Additional details:  Patient identified lesion of concern.  Lesion identified by physician.  Specimen 1 - Surgical pathology Differential Diagnosis:  Check Margins: No

## 2020-03-06 NOTE — Patient Instructions (Addendum)
s Surgery (Curettage) & Surgery (Excision) Aftercare Instructions  1. Okay to remove bandage in 24 hours  2. Wash area with soap and water  3. Apply Vaseline to area twice daily until healed (Not Neosporin)  4. Okay to cover with a Band-Aid to decrease the chance of infection or prevent irritation from clothing; also it's okay to uncover lesion at home.  5. Suture instructions: return to our office in 7-10 or 10-14 days for a nurse visit for suture removal. Variable healing with sutures, if pain or itching occurs call our office. It's okay to shower or bathe 24 hours after sutures are given.  6. The following risks may occur after a biopsy, curettage or excision: bleeding, scarring, discoloration, recurrence, infection (redness, yellow drainage, pain or swelling).  7. For questions, concerns and results call our office at [place] before 4pm & Friday before 3pm. Biopsy results will be available in 1 week.

## 2020-03-09 ENCOUNTER — Telehealth: Payer: Self-pay | Admitting: Dermatology

## 2020-03-10 ENCOUNTER — Encounter: Payer: Self-pay | Admitting: Dermatology

## 2020-03-10 ENCOUNTER — Other Ambulatory Visit: Payer: Self-pay

## 2020-03-10 NOTE — Telephone Encounter (Signed)
This encounter was created in error - please disregard.

## 2020-03-10 NOTE — Telephone Encounter (Signed)
I told Kyle Palmer that his biopsy showed a carcinoma in situ on his right forehead; he had ready check the results on MyChart.  He was told that this is a very low risk lesion that is typically cured with a simple curettage.  He will call my office to schedule this when convenient.  All his questions were answered.

## 2020-03-11 ENCOUNTER — Ambulatory Visit (INDEPENDENT_AMBULATORY_CARE_PROVIDER_SITE_OTHER): Payer: 59 | Admitting: Family Medicine

## 2020-03-11 ENCOUNTER — Encounter: Payer: Self-pay | Admitting: Family Medicine

## 2020-03-11 ENCOUNTER — Ambulatory Visit: Payer: 59 | Admitting: Dermatology

## 2020-03-11 VITALS — BP 100/60 | HR 84 | Temp 98.2°F | Ht 70.5 in | Wt 187.4 lb

## 2020-03-11 DIAGNOSIS — Z0000 Encounter for general adult medical examination without abnormal findings: Secondary | ICD-10-CM

## 2020-03-11 DIAGNOSIS — R002 Palpitations: Secondary | ICD-10-CM

## 2020-03-11 DIAGNOSIS — I10 Essential (primary) hypertension: Secondary | ICD-10-CM | POA: Diagnosis not present

## 2020-03-11 DIAGNOSIS — D0439 Carcinoma in situ of skin of other parts of face: Secondary | ICD-10-CM

## 2020-03-11 DIAGNOSIS — K219 Gastro-esophageal reflux disease without esophagitis: Secondary | ICD-10-CM

## 2020-03-11 DIAGNOSIS — N401 Enlarged prostate with lower urinary tract symptoms: Secondary | ICD-10-CM

## 2020-03-11 DIAGNOSIS — R350 Frequency of micturition: Secondary | ICD-10-CM

## 2020-03-11 DIAGNOSIS — E785 Hyperlipidemia, unspecified: Secondary | ICD-10-CM

## 2020-03-11 NOTE — Patient Instructions (Addendum)
Let me know if any increased palpitations/eye twitching!  Palpitations Palpitations are feelings that your heartbeat is irregular or is faster than normal. It may feel like your heart is fluttering or skipping a beat. Palpitations are usually not a serious problem. They may be caused by many things, including smoking, caffeine, alcohol, stress, and certain medicines or drugs. Most causes of palpitations are not serious. However, some palpitations can be a sign of a serious problem. You may need further tests to rule out serious medical problems. Follow these instructions at home:     Pay attention to any changes in your condition. Take these actions to help manage your symptoms: Eating and drinking  Avoid foods and drinks that may cause palpitations. These may include: ? Caffeinated coffee, tea, soft drinks, diet pills, and energy drinks. ? Chocolate. ? Alcohol. Lifestyle  Take steps to reduce your stress and anxiety. Things that can help you relax include: ? Yoga. ? Mind-body activities, such as deep breathing, meditation, or using words and images to create positive thoughts (guided imagery). ? Physical activity, such as swimming, jogging, or walking. Tell your health care provider if your palpitations increase with activity. If you have chest pain or shortness of breath with activity, do not continue the activity until you are seen by your health care provider. ? Biofeedback. This is a method that helps you learn to use your mind to control things in your body, such as your heartbeat.  Do not use drugs, including cocaine or ecstasy. Do not use marijuana.  Get plenty of rest and sleep. Keep a regular bed time. General instructions  Take over-the-counter and prescription medicines only as told by your health care provider.  Do not use any products that contain nicotine or tobacco, such as cigarettes and e-cigarettes. If you need help quitting, ask your health care provider.  Keep all  follow-up visits as told by your health care provider. This is important. These may include visits for further testing if palpitations do not go away or get worse. Contact a health care provider if you:  Continue to have a fast or irregular heartbeat after 24 hours.  Notice that your palpitations occur more often. Get help right away if you:  Have chest pain or shortness of breath.  Have a severe headache.  Feel dizzy or you faint. Summary  Palpitations are feelings that your heartbeat is irregular or is faster than normal. It may feel like your heart is fluttering or skipping a beat.  Palpitations may be caused by many things, including smoking, caffeine, alcohol, stress, certain medicines, and drugs.  Although most causes of palpitations are not serious, some causes can be a sign of a serious medical problem.  Get help right away if you faint or have chest pain, shortness of breath, a severe headache, or dizziness. This information is not intended to replace advice given to you by your health care provider. Make sure you discuss any questions you have with your health care provider. Document Revised: 01/17/2018 Document Reviewed: 01/17/2018 Elsevier Patient Education  2020 [employer].

## 2020-03-11 NOTE — Progress Notes (Signed)
GARNET ZAMORSKI DOB: Aug 13, 1961 Encounter date: 03/11/2020  This is a 59 y.o. male who presents for complete physical   History of present illness/Additional concerns: Has been very stressful during [place].  ?colonoscopy due 08/2018: will reschedule; had to cancel due to covid.  Watches what he eats so doesn't usually have issues; just nexium if eating trigger foods (less than weekly)  Past Medical History:  Diagnosis Date  . GERD (gastroesophageal reflux disease)   . Hyperlipidemia    History reviewed. No pertinent surgical history. No Known Allergies Current Meds  Medication Sig  . albuterol (PROVENTIL HFA;VENTOLIN HFA) 108 (90 Base) MCG/ACT inhaler Inhale 2 puffs into the lungs every 6 (six) hours as needed for wheezing or shortness of breath.  . esomeprazole (NEXIUM) 40 MG capsule Take 40 mg by mouth daily as needed.  . simvastatin (ZOCOR) 40 MG tablet TAKE 1 TABLET BY MOUTH EVERYDAY AT BEDTIME (Patient taking differently: Take 20 mg by mouth. )   Social History   Tobacco Use  . Smoking status: Former Research scientist (life sciences)  . Smokeless tobacco: Never Used  Substance Use Topics  . Alcohol use: Yes   Family History  Problem Relation Age of Onset  . Colon cancer Mother 2       history on NMSC  . Hypertension Father   . Parkinson's disease Father   . High Cholesterol Father   . Other Father        pancreatic tumor; not cancer  . Cancer Maternal Grandmother 39  . Cancer Maternal Grandfather 90  . Pancreatic cancer Paternal Grandfather      Review of Systems  Constitutional: Negative for activity change, appetite change, chills, fatigue, fever and unexpected weight change.  HENT: Negative for congestion, ear pain, hearing loss, sinus pressure, sinus pain, sore throat and trouble swallowing.   Eyes: Negative for pain and visual disturbance.  Respiratory: Negative for cough, chest tightness, shortness of breath and wheezing.   Cardiovascular: Negative for chest pain, palpitations  and leg swelling.  Gastrointestinal: Negative for abdominal distention, abdominal pain, blood in stool, constipation, diarrhea, nausea and vomiting.  Genitourinary: Negative for decreased urine volume, difficulty urinating, dysuria, penile pain and testicular pain.  Musculoskeletal: Negative for arthralgias, back pain and joint swelling.  Skin: Negative for rash.  Neurological: Negative for dizziness, weakness, numbness and headaches.  Hematological: Negative for adenopathy. Does not bruise/bleed easily.  Psychiatric/Behavioral: Negative for agitation, sleep disturbance and suicidal ideas. The patient is not nervous/anxious.     CBC:  Lab Results  Component Value Date   WBC 7.0 11/22/2018   HGB 15.3 11/22/2018   HCT 43.6 11/22/2018   MCHC 35.2 11/22/2018   RDW 13.4 11/22/2018   PLT 335.0 11/22/2018   CMP: Lab Results  Component Value Date   NA 137 01/20/2020   K 4.4 01/20/2020   CL 100 01/20/2020   CO2 30 01/20/2020   GLUCOSE 92 01/20/2020   BUN 16 01/20/2020   CREATININE 1.02 01/20/2020   CALCIUM 9.9 01/20/2020   PROT 7.2 01/20/2020   BILITOT 0.4 01/20/2020   ALKPHOS 74 01/20/2020   ALT 22 01/20/2020   AST 19 01/20/2020   LIPID: Lab Results  Component Value Date   CHOL 149 01/22/2020   TRIG 100.0 01/22/2020   HDL 41.30 01/22/2020   LDLCALC 88 01/22/2020    Objective:  BP 100/60 (BP Location: Right Arm, Patient Position: Sitting, Cuff Size: Normal)   Pulse 84   Temp 98.2 F (36.8 C) (Temporal)   Ht  5' 10.5" (1.791 m)   Wt 187 lb 6.4 oz (85 kg)   BMI 26.51 kg/m   Weight: 187 lb 6.4 oz (85 kg)   BP Readings from Last 3 Encounters:  03/11/20 100/60  12/24/18 120/64  11/22/18 124/84   Wt Readings from Last 3 Encounters:  03/11/20 187 lb 6.4 oz (85 kg)  12/24/18 188 lb 6.4 oz (85.5 kg)  11/22/18 195 lb 11.2 oz (88.8 kg)    Physical Exam Constitutional:      General: He is not in acute distress.    Appearance: He is well-developed.  HENT:     Head:  Normocephalic and atraumatic.     Right Ear: External ear normal.     Left Ear: External ear normal.     Nose: Nose normal.     Mouth/Throat:     Pharynx: No oropharyngeal exudate.  Eyes:     Conjunctiva/sclera: Conjunctivae normal.     Pupils: Pupils are equal, round, and reactive to light.  Neck:     Thyroid: No thyromegaly.  Cardiovascular:     Rate and Rhythm: Normal rate and regular rhythm.     Heart sounds: Normal heart sounds. No murmur. No friction rub. No gallop.   Pulmonary:     Effort: Pulmonary effort is normal. No respiratory distress.     Breath sounds: Normal breath sounds. No stridor. No wheezing or rales.  Abdominal:     General: Bowel sounds are normal.     Palpations: Abdomen is soft.  Musculoskeletal:        General: Normal range of motion.     Cervical back: Neck supple.  Skin:    General: Skin is warm and dry.  Neurological:     Mental Status: He is alert and oriented to person, place, and time.  Psychiatric:        Behavior: Behavior normal.        Thought Content: Thought content normal.        Judgment: Judgment normal.     Assessment/Plan: Health Maintenance Due  Topic Date Due  . HIV Screening  Never done   Health Maintenance reviewed.  1. Preventative health care Reviewed blood work today together in the office. Encouraged him to make sure he is taking time for his own health, mental health, and physical health. He does go on walks, but we discussed increasing intensity of walk for short bursts.  2. Benign essential hypertension Well-controlled. Continue current medication.  3. Gastroesophageal reflux disease without esophagitis Nexium just if needed for major food triggers.  4. Benign prostatic hyperplasia with urinary frequency Does well overall in terms of bladder emptying, urinary stream. No current treatment indicated  5. Hyperlipidemia, unspecified hyperlipidemia type Well-controlled with Zocor 40 mg.  6. Squamous cell  carcinoma in situ (SCCIS) of skin of forehead Following with dermatology on a regular basis.  7. Palpitations EKG stable with no acute changes in sinus rhythm. Let me know if any worsening of palpitations. We discussed triggers for this including stress, caffeine, alcohol. - EKG 12-Lead  Return in about 6 months (around 09/11/2020) for Chronic condition visit.  Micheline Rough, MD

## 2020-03-17 ENCOUNTER — Ambulatory Visit: Payer: 59 | Admitting: Dermatology

## 2020-03-30 ENCOUNTER — Other Ambulatory Visit: Payer: Self-pay

## 2020-03-30 ENCOUNTER — Encounter: Payer: Self-pay | Admitting: Dermatology

## 2020-03-30 ENCOUNTER — Ambulatory Visit: Payer: 59 | Admitting: Dermatology

## 2020-03-30 DIAGNOSIS — D099 Carcinoma in situ, unspecified: Secondary | ICD-10-CM

## 2020-03-30 DIAGNOSIS — D0439 Carcinoma in situ of skin of other parts of face: Secondary | ICD-10-CM | POA: Diagnosis not present

## 2020-03-30 DIAGNOSIS — W540XXA Bitten by dog, initial encounter: Secondary | ICD-10-CM

## 2020-03-30 DIAGNOSIS — S0123XA Puncture wound without foreign body of nose, initial encounter: Secondary | ICD-10-CM

## 2020-03-30 NOTE — Patient Instructions (Signed)
Kyle Palmer's dog pierced the skin on the left nostril when he leaned forward towards the dog on Saturday.  Examination showed a linear 1.5 cm scar on the outer nasal crease with an impetiginized surface.  Culture obtained.  He can continue to apply Neosporin twice daily pending culture.  There is no adenopathy.  I reviewed with Kyle Palmer the biopsy result of carcinoma in situ on his right forehead I emphasized that this is a low risk superficial skin cancer that is generally cured with a simple scraping, but advised him to wait to get this done until after the nose is healed.  Kyle Palmer will call me if the nasal lesion gets worse, otherwise I will recheck the nose at the time I remove the little skin cancer from his forehead in 3 to 4 weeks.  He has my cell phone if there is any problem.  Cell [phone].

## 2020-03-30 NOTE — Progress Notes (Signed)
   Follow-Up Visit   Subjective  Kyle Palmer is a 59 y.o. male who presents for the following: Skin Problem (Check patients nose. Dog bit patient friday and it bled alot. ) and Procedure (treat spot on right forehead. scc x1 we will get that scheduled.).  Dog bite Location: Left nostril Duration: 2 days Quality: Slightly improved Associated Signs/Symptoms: Crusting and minor pain Modifying Factors: Neosporin ointment Severity:  Timing: Context:   The following portions of the chart were reviewed this encounter and updated as appropriate:     Objective  Well appearing patient in no apparent distress; mood and affect are within normal limits.  A focused examination was performed including face.. Relevant physical exam findings are noted in the Assessment and Plan.   Assessment & Plan  Dog bite, initial encounter Left Tip of Nose  Culture. Continue Neosporin if continued improvement. Discussed risk of discoloration/scar.   Defer treatment of CIS right forehead.  Other Related Procedures Anaerobic and Aerobic Culture  Squamous cell carcinoma in situ Mid Forehead  Discussed low risk nature of CIS, defer Tx until dog bite heals.  Kyle Palmer's dog pierced the skin on the left nostril when he leaned forward towards the dog on Saturday.  Examination showed a linear 1.5 cm scar on the outer nasal crease with an impetiginized surface.  Culture obtained.  He can continue to apply Neosporin twice daily pending culture.  There is no adenopathy.  I reviewed with Jewell the biopsy result of carcinoma in situ on his right forehead I emphasized that this is a low risk superficial skin cancer that is generally cured with a simple scraping, but advised him to wait to get this done until after the nose is healed.  Garnell will call me if the nasal lesion gets worse, otherwise I will recheck the nose at the time I remove the little skin cancer from his forehead in 3 to 4 weeks.  He has my cell phone if  there is any problem.  Cell [phone].

## 2020-04-05 LAB — ANAEROBIC AND AEROBIC CULTURE
MICRO NUMBER:: 10351749
MICRO NUMBER:: 10351750
SPECIMEN QUALITY:: ADEQUATE
SPECIMEN QUALITY:: ADEQUATE

## 2020-04-23 ENCOUNTER — Other Ambulatory Visit: Payer: Self-pay

## 2020-04-23 ENCOUNTER — Ambulatory Visit (INDEPENDENT_AMBULATORY_CARE_PROVIDER_SITE_OTHER): Payer: 59 | Admitting: Dermatology

## 2020-04-23 ENCOUNTER — Encounter: Payer: Self-pay | Admitting: Dermatology

## 2020-04-23 DIAGNOSIS — D0439 Carcinoma in situ of skin of other parts of face: Secondary | ICD-10-CM

## 2020-04-23 DIAGNOSIS — D2339 Other benign neoplasm of skin of other parts of face: Secondary | ICD-10-CM

## 2020-04-23 DIAGNOSIS — D2239 Melanocytic nevi of other parts of face: Secondary | ICD-10-CM

## 2020-04-23 DIAGNOSIS — D099 Carcinoma in situ, unspecified: Secondary | ICD-10-CM

## 2020-04-23 NOTE — Progress Notes (Signed)
   Follow-Up Visit   Subjective  Kyle Palmer is a 59 y.o. male who presents for the following: Procedure (Here for treatment of CISx1 on right forehead.).  CIS Location: Right forehead  duration:  Quality:  Associated Signs/Symptoms: Modifying Factors:  Severity:  Timing: Context: For treatment  The following portions of the chart were reviewed this encounter and updated as appropriate:     Objective  Well appearing patient in no apparent distress; mood and affect are within normal limits.  A focused examination was performed including Head and neck. Relevant physical exam findings are noted in the Assessment and Plan.   Assessment & Plan  Fibrous papule of nose Right Nasal Sidewall  Biopsy if grows or bleeds  Squamous cell carcinoma in situ Right Forehead  Destruction of lesion Complexity: simple   Destruction method: electrodesiccation and curettage   Informed consent: discussed and consent obtained   Timeout:  patient name, date of birth, surgical site, and procedure verified Anesthesia: the lesion was anesthetized in a standard fashion   Anesthetic:  1% lidocaine w/ epinephrine 1-100,000 local infiltration Curettage performed in three different directions: Yes   Curettage cycles:  3 Lesion length (cm):  0.5 Lesion width (cm):  1 Margin per side (cm):  0 Final wound size (cm):  1 Hemostasis achieved with:  ferric subsulfate Outcome: patient tolerated procedure well with no complications   Post-procedure details: wound care instructions given   Additional details:  Inoculated with parenteral 5% fluorouracil

## 2020-04-23 NOTE — Patient Instructions (Signed)

## 2020-04-26 ENCOUNTER — Encounter: Payer: Self-pay | Admitting: Dermatology

## 2020-05-27 ENCOUNTER — Other Ambulatory Visit: Payer: Self-pay | Admitting: Family Medicine

## 2020-05-27 DIAGNOSIS — E785 Hyperlipidemia, unspecified: Secondary | ICD-10-CM

## 2020-08-30 ENCOUNTER — Other Ambulatory Visit: Payer: Self-pay | Admitting: Family Medicine

## 2020-08-30 DIAGNOSIS — E785 Hyperlipidemia, unspecified: Secondary | ICD-10-CM

## 2020-09-21 ENCOUNTER — Encounter: Payer: Self-pay | Admitting: Family Medicine

## 2020-09-21 ENCOUNTER — Telehealth: Payer: Self-pay | Admitting: Family Medicine

## 2020-09-21 NOTE — Telephone Encounter (Signed)
Patient is wanting to make an appointment and would like to come in the office. Can I use one of your virtual slots for an in office appointment.

## 2020-09-22 ENCOUNTER — Other Ambulatory Visit: Payer: Self-pay

## 2020-09-22 ENCOUNTER — Ambulatory Visit: Payer: 59 | Admitting: Family Medicine

## 2020-09-22 ENCOUNTER — Encounter: Payer: Self-pay | Admitting: Family Medicine

## 2020-09-22 VITALS — BP 140/90 | HR 72 | Temp 97.7°F | Ht 70.0 in | Wt 191.6 lb

## 2020-09-22 DIAGNOSIS — R002 Palpitations: Secondary | ICD-10-CM | POA: Diagnosis not present

## 2020-09-22 DIAGNOSIS — Z23 Encounter for immunization: Secondary | ICD-10-CM | POA: Diagnosis not present

## 2020-09-22 DIAGNOSIS — I10 Essential (primary) hypertension: Secondary | ICD-10-CM | POA: Diagnosis not present

## 2020-09-22 NOTE — Telephone Encounter (Signed)
Patient contacted the office this morning and decided to be seen by Dr. Sarajane Jews at 11:15.   Nothing further needed

## 2020-09-22 NOTE — Addendum Note (Signed)
Addended by: Matilde Sprang on: 09/22/2020 04:31 PM   Modules accepted: Orders

## 2020-09-22 NOTE — Progress Notes (Signed)
   Subjective:    Patient ID: Kyle Palmer, male    DOB: [birth year], 59 y.o.   MRN: [id number]  HPI Here for palpitations. He has had these off and on for years, but they have been much more frequent in the past month. These occur every day, numerous times a day. He describes these as "skips" or thumps in the chest. They occur one at a time, or sometimes 2 in a row. There is no chest pain or SOB. They are not associated with exertion. They do not wake him up at night. He says he has a very stressful job, and he drinks 3-4 cups of coffee every morning. He had a normal EKG here in March. He has also noticed his BP has been a little high for several months.   Review of Systems  Constitutional: Negative.   Respiratory: Negative.   Cardiovascular: Positive for palpitations. Negative for chest pain and leg swelling.       Objective:   Physical Exam Constitutional:      General: He is not in acute distress.    Appearance: Normal appearance.  Cardiovascular:     Rate and Rhythm: Normal rate and regular rhythm.     Pulses: Normal pulses.     Heart sounds: Normal heart sounds.     Comments: He has occasional ectopic beats Pulmonary:     Effort: Pulmonary effort is normal.     Breath sounds: Normal breath sounds.  Neurological:     Mental Status: He is alert.           Assessment & Plan:  Palpitations, likely either PACs or PVCs. We will arrange for him to wear a 48 hour Holter monitor to evaluate these more fully. I advised him to exercise regularly and to reduce his caffeine consumption. As for the HTN, these lifestyle changes should help as well.  Alysia Penna, MD

## 2020-10-05 ENCOUNTER — Ambulatory Visit (INDEPENDENT_AMBULATORY_CARE_PROVIDER_SITE_OTHER): Payer: 59

## 2020-10-05 DIAGNOSIS — R002 Palpitations: Secondary | ICD-10-CM

## 2020-10-15 ENCOUNTER — Other Ambulatory Visit: Payer: Self-pay | Admitting: Family Medicine

## 2020-10-15 DIAGNOSIS — R002 Palpitations: Secondary | ICD-10-CM

## 2020-11-05 LAB — HM COLONOSCOPY

## 2020-11-10 ENCOUNTER — Encounter: Payer: Self-pay | Admitting: Family Medicine

## 2021-01-27 ENCOUNTER — Other Ambulatory Visit: Payer: Self-pay | Admitting: Family Medicine

## 2021-01-27 DIAGNOSIS — E785 Hyperlipidemia, unspecified: Secondary | ICD-10-CM

## 2021-12-30 ENCOUNTER — Other Ambulatory Visit: Payer: Self-pay

## 2021-12-30 ENCOUNTER — Encounter: Payer: Self-pay | Admitting: Dermatology

## 2021-12-30 ENCOUNTER — Ambulatory Visit: Payer: 59 | Admitting: Dermatology

## 2021-12-30 DIAGNOSIS — D1801 Hemangioma of skin and subcutaneous tissue: Secondary | ICD-10-CM

## 2021-12-30 DIAGNOSIS — D485 Neoplasm of uncertain behavior of skin: Secondary | ICD-10-CM

## 2021-12-30 DIAGNOSIS — D224 Melanocytic nevi of scalp and neck: Secondary | ICD-10-CM

## 2021-12-30 DIAGNOSIS — L821 Other seborrheic keratosis: Secondary | ICD-10-CM

## 2021-12-30 DIAGNOSIS — D2239 Melanocytic nevi of other parts of face: Secondary | ICD-10-CM

## 2021-12-30 DIAGNOSIS — Z1283 Encounter for screening for malignant neoplasm of skin: Secondary | ICD-10-CM

## 2021-12-30 NOTE — Patient Instructions (Signed)

## 2022-01-02 ENCOUNTER — Encounter: Payer: Self-pay | Admitting: Family Medicine

## 2022-01-04 ENCOUNTER — Telehealth: Payer: Self-pay | Admitting: Family Medicine

## 2022-01-04 ENCOUNTER — Other Ambulatory Visit: Payer: Self-pay | Admitting: Family Medicine

## 2022-01-04 DIAGNOSIS — Z13220 Encounter for screening for lipoid disorders: Secondary | ICD-10-CM

## 2022-01-04 DIAGNOSIS — Z131 Encounter for screening for diabetes mellitus: Secondary | ICD-10-CM

## 2022-01-04 DIAGNOSIS — E785 Hyperlipidemia, unspecified: Secondary | ICD-10-CM

## 2022-01-04 NOTE — Telephone Encounter (Signed)
Pt is sch for cpe on 01-07-22 and would like to come in tomorrow to have cpe labs. Pt has also sent mychart message. Please advise

## 2022-01-04 NOTE — Telephone Encounter (Signed)
Mychart message response sent. Labs ordered

## 2022-01-04 NOTE — Telephone Encounter (Signed)
Pt has been sch

## 2022-01-05 ENCOUNTER — Other Ambulatory Visit (INDEPENDENT_AMBULATORY_CARE_PROVIDER_SITE_OTHER): Payer: 59

## 2022-01-05 DIAGNOSIS — E785 Hyperlipidemia, unspecified: Secondary | ICD-10-CM

## 2022-01-05 LAB — COMPREHENSIVE METABOLIC PANEL
ALT: 19 U/L (ref 0–53)
AST: 19 U/L (ref 0–37)
Albumin: 4.5 g/dL (ref 3.5–5.2)
Alkaline Phosphatase: 64 U/L (ref 39–117)
BUN: 18 mg/dL (ref 6–23)
CO2: 28 mEq/L (ref 19–32)
Calcium: 9.7 mg/dL (ref 8.4–10.5)
Chloride: 102 mEq/L (ref 96–112)
Creatinine, Ser: 1.07 mg/dL (ref 0.40–1.50)
GFR: 74.6 mL/min (ref >60.00)
Glucose, Bld: 92 mg/dL (ref 70–99)
Potassium: 4.3 mEq/L (ref 3.5–5.1)
Sodium: 138 mEq/L (ref 135–145)
Total Bilirubin: 0.5 mg/dL (ref 0.2–1.2)
Total Protein: 7.3 g/dL (ref 6.0–8.3)

## 2022-01-05 LAB — LIPID PANEL
Cholesterol: 197 mg/dL (ref 0–200)
HDL: 43.7 mg/dL (ref >39.00)
LDL Cholesterol: 120 mg/dL — ABNORMAL HIGH (ref 0–99)
NonHDL: 153.67
Total CHOL/HDL Ratio: 5
Triglycerides: 168 mg/dL — ABNORMAL HIGH (ref 0.0–149.0)
VLDL: 33.6 mg/dL (ref 0.0–40.0)

## 2022-01-05 LAB — CBC WITH DIFFERENTIAL/PLATELET
Basophils Absolute: 0.1 10*3/uL (ref 0.0–0.1)
Basophils Relative: 0.9 % (ref 0.0–3.0)
Eosinophils Absolute: 0.4 10*3/uL (ref 0.0–0.7)
Eosinophils Relative: 7.5 % — ABNORMAL HIGH (ref 0.0–5.0)
HCT: 41.2 % (ref 39.0–52.0)
Hemoglobin: 14.4 g/dL (ref 13.0–17.0)
Lymphocytes Relative: 32.2 % (ref 12.0–46.0)
Lymphs Abs: 1.9 10*3/uL (ref 0.7–4.0)
MCHC: 34.9 g/dL (ref 30.0–36.0)
MCV: 103.9 fl — ABNORMAL HIGH (ref 78.0–100.0)
Monocytes Absolute: 0.7 10*3/uL (ref 0.1–1.0)
Monocytes Relative: 11.6 % (ref 3.0–12.0)
Neutro Abs: 2.8 10*3/uL (ref 1.4–7.7)
Neutrophils Relative %: 47.8 % (ref 43.0–77.0)
Platelets: 335 10*3/uL (ref 150.0–400.0)
RBC: 3.96 Mil/uL — ABNORMAL LOW (ref 4.22–5.81)
RDW: 13.3 % (ref 11.5–15.5)
WBC: 5.9 10*3/uL (ref 4.0–10.5)

## 2022-01-05 NOTE — Telephone Encounter (Signed)
Request to add a lab faxed to [phone].   Called karen who verified receipt of form.

## 2022-01-07 ENCOUNTER — Ambulatory Visit (INDEPENDENT_AMBULATORY_CARE_PROVIDER_SITE_OTHER): Payer: 59 | Admitting: Family Medicine

## 2022-01-07 ENCOUNTER — Encounter: Payer: Self-pay | Admitting: Family Medicine

## 2022-01-07 VITALS — BP 120/78 | HR 80 | Temp 98.2°F | Ht 70.0 in | Wt 187.0 lb

## 2022-01-07 DIAGNOSIS — N401 Enlarged prostate with lower urinary tract symptoms: Secondary | ICD-10-CM

## 2022-01-07 DIAGNOSIS — K219 Gastro-esophageal reflux disease without esophagitis: Secondary | ICD-10-CM

## 2022-01-07 DIAGNOSIS — R718 Other abnormality of red blood cells: Secondary | ICD-10-CM

## 2022-01-07 DIAGNOSIS — I10 Essential (primary) hypertension: Secondary | ICD-10-CM | POA: Diagnosis not present

## 2022-01-07 DIAGNOSIS — Z0000 Encounter for general adult medical examination without abnormal findings: Secondary | ICD-10-CM | POA: Diagnosis not present

## 2022-01-07 DIAGNOSIS — E785 Hyperlipidemia, unspecified: Secondary | ICD-10-CM

## 2022-01-07 DIAGNOSIS — R350 Frequency of micturition: Secondary | ICD-10-CM | POA: Diagnosis not present

## 2022-01-07 LAB — IBC + FERRITIN
Ferritin: 300.2 ng/mL (ref 22.0–322.0)
Iron: 111 ug/dL (ref 42–165)
Saturation Ratios: 23.5 % (ref 20.0–50.0)
TIBC: 471.8 ug/dL — ABNORMAL HIGH (ref 250.0–450.0)
Transferrin: 337 mg/dL (ref 212.0–360.0)

## 2022-01-07 LAB — PSA: PSA: 0.4 ng/mL (ref 0.10–4.00)

## 2022-01-07 LAB — VITAMIN B12: Vitamin B-12: 494 pg/mL (ref 211–911)

## 2022-01-07 LAB — FOLATE: Folate: 11.9 ng/mL (ref >5.9)

## 2022-01-07 NOTE — Progress Notes (Signed)
Kyle Palmer DOB: 09-Jun-1961 Encounter date: 01/07/2022  This is a 61 y.o. male who presents for complete physical   History of present illness/Additional concerns: Still some stress, but better.   Feels rested with sleep, although does wake every few hours to urinate. Falls back asleep ok.   Acid reflux: nexium 40mg  just a few times/month more preventative.  HL: simvastatin 40mg  - hasn't been taking medication on a regular basis.   Has been more congested lately.   Colonoscopy completed 10/2020: repeat in 10/2025. Had polyps. 5 year repeat due to mom with colon cancer.   No palpitations since being better about hydration.   Past Medical History:  Diagnosis Date   GERD (gastroesophageal reflux disease)    Hyperlipidemia    Squamous cell carcinoma of skin 03/06/2020   in situ on right forehead - CX3+5FU   History reviewed. No pertinent surgical history. No Known Allergies Current Meds  Medication Sig   esomeprazole (NEXIUM) 40 MG capsule Take 40 mg by mouth daily as needed.   simvastatin (ZOCOR) 40 MG tablet TAKE 1 TABLET BY MOUTH EVERYDAY AT BEDTIME   Social History   Tobacco Use   Smoking status: Former   Smokeless tobacco: Never  Substance Use Topics   Alcohol use: Yes   Family History  Problem Relation Age of Onset   Colon cancer Mother 75       history on NMSC   Hypertension Father    Parkinson's disease Father    High Cholesterol Father    Other Father        pancreatic tumor; not cancer   Cancer Maternal Grandmother 7   Cancer Maternal Grandfather 90   Pancreatic cancer Paternal Grandfather      Review of Systems  Constitutional:  Negative for activity change, appetite change, chills, fatigue, fever and unexpected weight change.  HENT:  Negative for congestion, ear pain, hearing loss, sinus pressure, sinus pain, sore throat and trouble swallowing.   Eyes:  Negative for pain and visual disturbance.  Respiratory:  Negative for cough, chest  tightness, shortness of breath and wheezing.   Cardiovascular:  Negative for chest pain, palpitations and leg swelling.  Gastrointestinal:  Negative for abdominal distention, abdominal pain, blood in stool, constipation, diarrhea, nausea and vomiting.  Genitourinary:  Negative for decreased urine volume, difficulty urinating, dysuria, penile pain and testicular pain.  Musculoskeletal:  Negative for arthralgias, back pain and joint swelling.  Skin:  Negative for rash.  Neurological:  Negative for dizziness, weakness, numbness and headaches.  Hematological:  Negative for adenopathy. Does not bruise/bleed easily.  Psychiatric/Behavioral:  Negative for agitation, sleep disturbance and suicidal ideas. The patient is not nervous/anxious.    CBC:  Lab Results  Component Value Date   WBC 5.9 01/05/2022   HGB 14.4 01/05/2022   HCT 41.2 01/05/2022   MCHC 34.9 01/05/2022   RDW 13.3 01/05/2022   PLT 335.0 01/05/2022   CMP: Lab Results  Component Value Date   NA 138 01/05/2022   K 4.3 01/05/2022   CL 102 01/05/2022   CO2 28 01/05/2022   GLUCOSE 92 01/05/2022   BUN 18 01/05/2022   CREATININE 1.07 01/05/2022   CALCIUM 9.7 01/05/2022   PROT 7.3 01/05/2022   BILITOT 0.5 01/05/2022   ALKPHOS 64 01/05/2022   ALT 19 01/05/2022   AST 19 01/05/2022   LIPID: Lab Results  Component Value Date   CHOL 197 01/05/2022   TRIG 168.0 (H) 01/05/2022   HDL 43.70 01/05/2022  LDLCALC 120 (H) 01/05/2022    Objective:  BP 120/78 (BP Location: Left Arm, Patient Position: Sitting, Cuff Size: Large)    Pulse 80    Temp 98.2 F (36.8 C) (Oral)    Ht 5\' 10"  (1.778 m)    Wt 187 lb (84.8 kg)    SpO2 98%    BMI 26.83 kg/m   Weight: 187 lb (84.8 kg)   BP Readings from Last 3 Encounters:  01/07/22 120/78  09/22/20 140/90  03/11/20 100/60   Wt Readings from Last 3 Encounters:  01/07/22 187 lb (84.8 kg)  09/22/20 191 lb 9.6 oz (86.9 kg)  03/11/20 187 lb 6.4 oz (85 kg)    Physical  Exam  Assessment/Plan: There are no preventive care reminders to display for this patient.   Health Maintenance reviewed - will come back for shingles but wants to get on another weekend.  1. Preventative health care Discussed adding in exercise.   2. Benign essential hypertension Well controlled.  Not on medication.   3. Gastroesophageal reflux disease without esophagitis Nexium prn.   4. Benign prostatic hyperplasia with urinary frequency Patient prefers to get lab test.  - PSA; Future  5. Hyperlipidemia, unspecified hyperlipidemia type He didn't take zocor last month. Will restart and we will plan to recheck labs in 69mo.  6. Elevated MCV - Vitamin B12; Future - Folate; Future - Homocysteine; Future - Methylmalonic acid, serum; Future  7. RBC abnormality - IBC + Ferritin; Future    Return for pending labs.  Micheline Rough, MD

## 2022-01-11 ENCOUNTER — Encounter: Payer: Self-pay | Admitting: Family Medicine

## 2022-01-11 LAB — METHYLMALONIC ACID, SERUM: Methylmalonic Acid, Quant: 77 nmol/L — ABNORMAL LOW (ref 87–318)

## 2022-01-11 LAB — HOMOCYSTEINE: Homocysteine: 14.9 umol/L — ABNORMAL HIGH (ref <11.4)

## 2022-01-12 ENCOUNTER — Other Ambulatory Visit: Payer: Self-pay | Admitting: Family Medicine

## 2022-01-12 MED ORDER — VITAMIN B-12 1000 MCG PO TABS: ORAL_TABLET | ORAL | 1 refills | Status: DC

## 2022-01-12 MED ORDER — FOLIC ACID 1 MG PO TABS: 1.0000 mg | ORAL_TABLET | Freq: Every day | ORAL | 1 refills | Status: DC

## 2022-01-14 ENCOUNTER — Other Ambulatory Visit: Payer: Self-pay | Admitting: Family Medicine

## 2022-01-14 MED ORDER — B-12 (METHYLCOBALAMIN) 1000 MCG SL SUBL: 1.0000 | SUBLINGUAL_TABLET | Freq: Every day | SUBLINGUAL | 1 refills | Status: DC

## 2022-01-14 MED ORDER — METHYLCOBALAMIN 1000 MCG SL SUBL: 1000.0000 ug | SUBLINGUAL_TABLET | Freq: Every day | SUBLINGUAL | 1 refills | Status: DC

## 2022-01-22 ENCOUNTER — Encounter: Payer: Self-pay | Admitting: Dermatology

## 2022-01-22 NOTE — Progress Notes (Signed)
° °  Follow-Up Visit   Subjective  Kyle Palmer is a 61 y.o. male who presents for the following: Skin Problem (Pt here to have some spots of concern evaluated on the L temple, R upper back and have a skin tag removal on the posterior neck. Pt denies any discomfort ).  General skin examination, several spots that patient has noticed including side of the neck that is changed Location:  Duration:  Quality:  Associated Signs/Symptoms: Modifying Factors:  Severity:  Timing: Context:   Objective  Well appearing patient in no apparent distress; mood and affect are within normal limits. Scalp Upper body exam no atypical pigmented lesions.  Multiple fibrous papules on nose   Sks and angiomas    Neck - Posterior Waxy pink 5 mm papule with eccentric 1 mm hornlike projection       Dorsum of Nose Multiple 2 mm domed flesh-colored papules paranasal compatible with angiofibroma  Mid Back Light brown 5 mm flattopped textured papule  Right Abdomen (side) - Upper Smooth red 2 mm dermal papule    All skin waist up examined.   Assessment & Plan    Screening exam for skin cancer Scalp  Annual skin examination, encouraged to self examine with spouse twice annually.  Continue ultraviolet protection.  Neoplasm of uncertain behavior of skin Neck - Posterior  Skin / nail biopsy Type of biopsy: tangential   Informed consent: discussed and consent obtained   Timeout: patient name, date of birth, surgical site, and procedure verified   Anesthesia: the lesion was anesthetized in a standard fashion   Anesthetic:  1% lidocaine w/ epinephrine 1-100,000 local infiltration Instrument used: flexible razor blade   Hemostasis achieved with: aluminum chloride and electrodesiccation   Outcome: patient tolerated procedure well   Post-procedure details: wound care instructions given    Specimen 1 - Surgical pathology Differential Diagnosis: R/O TAG MOLE   Check Margins:  No  Fibrous papule of nose Dorsum of Nose  Patient told of similar appearance of early BCC so if any lesions grow or bleed return for biopsy  Seborrheic keratosis Mid Back  Leave if stable  Hemangioma of skin Right Abdomen (side) - Upper  No intervention necessary      I, Lavonna Monarch, MD, have reviewed all documentation for this visit.  The documentation on 01/22/22 for the exam, diagnosis, procedures, and orders are all accurate and complete.

## 2022-01-24 ENCOUNTER — Other Ambulatory Visit: Payer: Self-pay | Admitting: Family Medicine

## 2022-01-24 DIAGNOSIS — E785 Hyperlipidemia, unspecified: Secondary | ICD-10-CM

## 2022-02-16 ENCOUNTER — Encounter: Payer: Self-pay | Admitting: Family Medicine

## 2022-02-16 ENCOUNTER — Ambulatory Visit: Payer: 59 | Admitting: Family Medicine

## 2022-02-16 VITALS — BP 122/82 | HR 79 | Temp 98.2°F | Ht 70.0 in | Wt 186.0 lb

## 2022-02-16 DIAGNOSIS — E785 Hyperlipidemia, unspecified: Secondary | ICD-10-CM | POA: Diagnosis not present

## 2022-02-16 DIAGNOSIS — H9313 Tinnitus, bilateral: Secondary | ICD-10-CM

## 2022-02-16 DIAGNOSIS — E538 Deficiency of other specified B group vitamins: Secondary | ICD-10-CM | POA: Diagnosis not present

## 2022-02-16 NOTE — Progress Notes (Signed)
?  Kyle Palmer ?DOB: November 19, 1961 ?Encounter date: 02/16/2022 ? ?This is a 61 y.o. male who presents with ?Chief Complaint  ?Patient presents with  ? Tinnitus  ?  X1 year  ? ? ?History of present illness: ?Has had ringing in ears that has been progressively worsening. Forgot to discuss at his last visit. Hasn't been that annoying. Not obvious start date. Really just gradual. No work exposure to excessive noise, no significant noise damage that he recalls. Doesn't feel like hearing is affected.  ? ?Doesn't affect sleep.  ? ?No ear pain.  ? ?2 weeks ago did get sick- fever, URI, chest congestion. Passed pretty quickly in a few days. Covid testing negative.  ? ?No Known Allergies ?Current Meds  ?Medication Sig  ? esomeprazole (NEXIUM) 40 MG capsule Take 40 mg by mouth daily as needed.  ? folic acid (FOLVITE) 1 MG tablet Take 1 tablet (1 mg total) by mouth daily.  ? Methylcobalamin 1000 MCG SUBL Place 1,000 mcg under the tongue daily.  ? simvastatin (ZOCOR) 40 MG tablet TAKE 1 TABLET BY MOUTH EVERYDAY AT BEDTIME  ? ? ?Review of Systems  ?Constitutional:  Negative for chills, fatigue and fever.  ?Respiratory:  Negative for cough, chest tightness, shortness of breath and wheezing.   ?Cardiovascular:  Negative for chest pain, palpitations and leg swelling.  ? ?Objective: ? ?BP 122/82 (BP Location: Left Arm, Patient Position: Sitting, Cuff Size: Normal)   Pulse 79   Temp 98.2 ?F (36.8 ?C) (Oral)   Ht 5\' 10"  (1.778 m)   Wt 186 lb (84.4 kg)   SpO2 98%   BMI 26.69 kg/m?   Weight: 186 lb (84.4 kg)  ? ?BP Readings from Last 3 Encounters:  ?02/16/22 122/82  ?01/07/22 120/78  ?09/22/20 140/90  ? ?Wt Readings from Last 3 Encounters:  ?02/16/22 186 lb (84.4 kg)  ?01/07/22 187 lb (84.8 kg)  ?09/22/20 191 lb 9.6 oz (86.9 kg)  ? ? ?Physical Exam ?Constitutional:   ?   General: He is not in acute distress. ?   Appearance: He is well-developed.  ?HENT:  ?   Head: Normocephalic.  ?   Right Ear: Tympanic membrane, ear canal and  external ear normal.  ?   Left Ear: Tympanic membrane, ear canal and external ear normal.  ?Cardiovascular:  ?   Rate and Rhythm: Regular rhythm.  ?   Heart sounds: Normal heart sounds. No murmur heard. ?  No friction rub.  ?Pulmonary:  ?   Effort: Pulmonary effort is normal.  ?Musculoskeletal:  ?   Right lower leg: No edema.  ?   Left lower leg: No edema.  ?Neurological:  ?   Mental Status: He is alert and oriented to person, place, and time.  ?Psychiatric:     ?   Behavior: Behavior normal.  ? ? ?Assessment/Plan ? ?1. Tinnitus of both ears ?Would like to follow up with audiology. Referral has been placed. Further eval or consideration for specialty eval (ENT) pending audiology report. No other sx associated with tinnitus.  ?- Ambulatory referral to Audiology ? ?He is taking B12 and folate for deficiencies and compliant with cholesterol med. He will follow up in a month for recheck of bloodwork abnormalities.  ? ? ?Return for lab visit in 70month. ? ? ? ? ?Micheline Rough, MD ?

## 2022-02-28 ENCOUNTER — Other Ambulatory Visit: Payer: Self-pay

## 2022-02-28 ENCOUNTER — Ambulatory Visit: Payer: 59 | Attending: Family Medicine | Admitting: Audiologist

## 2022-02-28 DIAGNOSIS — H9313 Tinnitus, bilateral: Secondary | ICD-10-CM | POA: Diagnosis not present

## 2022-02-28 DIAGNOSIS — H903 Sensorineural hearing loss, bilateral: Secondary | ICD-10-CM | POA: Insufficient documentation

## 2022-02-28 NOTE — Procedures (Signed)
?[facility] ?[address] ?[place], [place]  [address] ?[phone] ? ?AUDIOLOGICAL  EVALUATION ? ?NAME: Kyle Palmer     ?DOB:   [birth year]      ?MRN: [id number]                                                                                     ?DATE: 02/28/2022     ?REFERENT: Caren Macadam, MD ?STATUS: Outpatient ?DIAGNOSIS: Tinnitus, Sensorineural Hearing Loss  ? ? ?History: ?Salbador was seen for an audiological evaluation.  ?Calistro is receiving a hearing evaluation due to concerns for ringing in his ears that is gradually getting worse. Maynor has noticed ringing for many years, in the last three years it has gotten much worse. The tinnitus is now constant. He is a busy person so he is able to ignore it and has lot let it impact his life. He notices it most driving his new quiet car. Ho no significant difficulty hearing. This change in his tinnitus happened gradually.  No pain or pressure reported in either ear. Eusebio's parents are both living and both have hearing loss. Bartley has no significant history of noise exposure. There are loud machines at his work but he does not work near them. Antonious says he has been under increased stress the last few years. [place] uses a noise machine at night to help him sleep. Medical history negative for a condition which is a risk factor for hearing loss. No other relevant case history reported.  ? ? ?Evaluation:  ?Otoscopy showed a clear view of the tympanic membranes, bilaterally ?Tympanometry results were consistent with normal middle ear function, bilaterally   ?Audiometric testing was completed using conventional audiometry with insert transducer. Speech Recognition Thresholds were consistent with pure tone averages. Word Recognition was excellent at conversation level, 40dB SL. Pure tone thresholds show normal sloping to a mild hearing loss in both ears at 4k Hz and above. Test results are consistent with mild sensorineural  hearing loss in each ear at highest frequencies.  ?Tinnitus matched to approximately 7k Hz at 10dB SL.  ? ?Results:  ?The test results were reviewed with Dominica Severin. He has a mild hearing loss in the highest pitches tested today. This hearing loss will not interfere with day to day communication. The tinnitus matched to the pitches at which he has the hearing loss. The hearing loss is likely the cause of the ringing, and his recent change in stress levels has made the tinnitus worse. High stress causes tinnitus to be more pronounced. He needs to continue using masking sounds like white noise to buffer his tinnitus at night. Lowering is stress will cause the tinnitus to fade back into the levels it was before. He was given a packet of information including information on an online mindfulness class for people with tinnitus.  ? ?Recommendations: ?Recommend reducing tinnitus awareness using class through: [email or web address]  ?See packet of information on use of masking noise to decrease tinnitus awareness ?Return for audiologic monitoring every other year, sooner if changes in hearing or tinnitus are perceived ?  ?Alfonse Alpers  ?Audiologist, Au.D., CCC-A ?02/28/2022  9:11 AM ? ?Cc:  Caren Macadam, MD  ?

## 2022-06-14 ENCOUNTER — Encounter: Payer: Self-pay | Admitting: Dermatology

## 2022-06-14 ENCOUNTER — Ambulatory Visit: Payer: 59 | Admitting: Dermatology

## 2022-06-14 DIAGNOSIS — D2339 Other benign neoplasm of skin of other parts of face: Secondary | ICD-10-CM | POA: Diagnosis not present

## 2022-06-14 DIAGNOSIS — L821 Other seborrheic keratosis: Secondary | ICD-10-CM

## 2022-06-14 DIAGNOSIS — Z1283 Encounter for screening for malignant neoplasm of skin: Secondary | ICD-10-CM | POA: Diagnosis not present

## 2022-06-14 DIAGNOSIS — D1801 Hemangioma of skin and subcutaneous tissue: Secondary | ICD-10-CM | POA: Diagnosis not present

## 2022-06-14 DIAGNOSIS — D2330 Other benign neoplasm of skin of unspecified part of face: Secondary | ICD-10-CM

## 2022-06-14 DIAGNOSIS — L814 Other melanin hyperpigmentation: Secondary | ICD-10-CM

## 2022-07-11 ENCOUNTER — Encounter: Payer: Self-pay | Admitting: Dermatology

## 2022-07-11 NOTE — Progress Notes (Signed)
   Follow-Up Visit   Subjective  Kyle Palmer is a 61 y.o. male who presents for the following: Follow-up (Right arm x2  lesions right chest re check ).  Skin check,, several areas of concern Location:  Duration:  Quality:  Associated Signs/Symptoms: Modifying Factors:  Severity:  Timing: Context:   Objective  Well appearing patient in no apparent distress; mood and affect are within normal limits. General skin check: No atypical pigmented lesions, no nonmelanoma skin cancer  Head - Anterior (Face) 2 mm domed non-umbilicated flesh-colored papule, no dermoscopic atypia  Head - Anterior (Face), Right Breast Multiple textured light brown flattopped 4 to 8 mm papules, compatible dermoscopy  Right Abdomen (side) - Upper Several 2 mm smooth red dermal papules  Right Antecubital Fossa, Right Forearm - Anterior, Right Upper Arm - Anterior Monochrome tan 5 mm macules, no dermoscopic atypia    A full examination was performed including scalp, head, eyes, ears, nose, lips, neck, chest, axillae, abdomen, back, buttocks, bilateral upper extremities, bilateral lower extremities, hands, feet, fingers, toes, fingernails, and toenails. All findings within normal limits unless otherwise noted below.   Assessment & Plan    Fibrous papule of face Head - Anterior (Face)  Told of similar appearance of early BCC so return if there is growth or bleeding  Seborrheic keratosis (2) Head - Anterior (Face); Right Breast  Leave if stable  Hemangioma of skin Right Abdomen (side) - Upper  No intervention necessary  Lentigo simplex (3) Right Antecubital Fossa; Right Upper Arm - Anterior; Right Forearm - Anterior  Check as needed change  Screening for malignant neoplasm of skin  Annual skin examination, encouraged to self examine with spouse twice annually, continued ultraviolet protection.      I, Lavonna Monarch, MD, have reviewed all documentation for this visit.  The  documentation on 07/11/22 for the exam, diagnosis, procedures, and orders are all accurate and complete.

## 2022-07-12 ENCOUNTER — Other Ambulatory Visit: Payer: Self-pay | Admitting: *Deleted

## 2022-07-12 MED ORDER — FOLIC ACID 1 MG PO TABS: 1.0000 mg | ORAL_TABLET | Freq: Every day | ORAL | 0 refills | Status: DC

## 2022-08-05 ENCOUNTER — Other Ambulatory Visit: Payer: Self-pay | Admitting: *Deleted

## 2022-08-05 DIAGNOSIS — E785 Hyperlipidemia, unspecified: Secondary | ICD-10-CM

## 2022-08-05 MED ORDER — SIMVASTATIN 40 MG PO TABS: ORAL_TABLET | ORAL | 0 refills | Status: DC

## 2022-10-11 ENCOUNTER — Other Ambulatory Visit: Payer: Self-pay | Admitting: Family Medicine

## 2022-10-25 ENCOUNTER — Other Ambulatory Visit: Payer: Self-pay | Admitting: Family Medicine

## 2022-10-25 DIAGNOSIS — E785 Hyperlipidemia, unspecified: Secondary | ICD-10-CM

## 2022-10-26 ENCOUNTER — Other Ambulatory Visit: Payer: Self-pay | Admitting: Rehabilitation

## 2022-10-26 DIAGNOSIS — M4716 Other spondylosis with myelopathy, lumbar region: Secondary | ICD-10-CM

## 2022-10-26 DIAGNOSIS — M5416 Radiculopathy, lumbar region: Secondary | ICD-10-CM

## 2022-11-04 ENCOUNTER — Telehealth: Payer: Self-pay | Admitting: Family Medicine

## 2022-11-04 NOTE — Telephone Encounter (Signed)
Patient request TOC to [facility]

## 2022-11-07 ENCOUNTER — Encounter: Payer: 59 | Admitting: Family Medicine

## 2022-11-08 ENCOUNTER — Ambulatory Visit
Admission: RE | Admit: 2022-11-08 | Discharge: 2022-11-08 | Disposition: A | Discharge status: Home / Self Care | Payer: 59 | Source: Ambulatory Visit | Attending: Rehabilitation | Admitting: Rehabilitation

## 2022-11-08 DIAGNOSIS — M4716 Other spondylosis with myelopathy, lumbar region: Secondary | ICD-10-CM

## 2022-11-08 DIAGNOSIS — M5416 Radiculopathy, lumbar region: Secondary | ICD-10-CM

## 2022-11-08 NOTE — Telephone Encounter (Signed)
Pt calling back to set appt. up

## 2022-11-08 NOTE — Telephone Encounter (Signed)
Patient notified of update  and verbalized understanding. 

## 2022-12-08 DIAGNOSIS — Z9889 Other specified postprocedural states: Secondary | ICD-10-CM | POA: Insufficient documentation

## 2023-01-02 ENCOUNTER — Ambulatory Visit: Payer: 59 | Admitting: Dermatology

## 2023-02-16 ENCOUNTER — Other Ambulatory Visit: Payer: Self-pay | Admitting: Family Medicine

## 2023-02-16 DIAGNOSIS — E785 Hyperlipidemia, unspecified: Secondary | ICD-10-CM

## 2023-02-16 NOTE — Telephone Encounter (Signed)
Looks like he requested a TOC to [facility]-- hasn't been in to transition his care-- he will need an appointment with Tommi Rumps or you can send this to his MA

## 2023-03-13 ENCOUNTER — Ambulatory Visit: Payer: 59 | Admitting: Internal Medicine

## 2023-03-13 ENCOUNTER — Encounter: Payer: Self-pay | Admitting: Internal Medicine

## 2023-03-13 VITALS — BP 110/88 | HR 70 | Temp 98.2°F | Ht 70.0 in | Wt 183.8 lb

## 2023-03-13 DIAGNOSIS — Z5986 Financial insecurity: Secondary | ICD-10-CM

## 2023-03-13 DIAGNOSIS — E785 Hyperlipidemia, unspecified: Secondary | ICD-10-CM

## 2023-03-13 DIAGNOSIS — D7589 Other specified diseases of blood and blood-forming organs: Secondary | ICD-10-CM

## 2023-03-13 DIAGNOSIS — K219 Gastro-esophageal reflux disease without esophagitis: Secondary | ICD-10-CM | POA: Diagnosis not present

## 2023-03-13 DIAGNOSIS — Z566 Other physical and mental strain related to work: Secondary | ICD-10-CM

## 2023-03-13 DIAGNOSIS — R252 Cramp and spasm: Secondary | ICD-10-CM

## 2023-03-13 DIAGNOSIS — R350 Frequency of micturition: Secondary | ICD-10-CM

## 2023-03-13 DIAGNOSIS — Z789 Other specified health status: Secondary | ICD-10-CM

## 2023-03-13 DIAGNOSIS — I10 Essential (primary) hypertension: Secondary | ICD-10-CM

## 2023-03-13 DIAGNOSIS — E663 Overweight: Secondary | ICD-10-CM

## 2023-03-13 DIAGNOSIS — N401 Enlarged prostate with lower urinary tract symptoms: Secondary | ICD-10-CM

## 2023-03-13 HISTORY — DX: Cramp and spasm: R25.2

## 2023-03-13 HISTORY — DX: Other physical and mental strain related to work: Z56.6

## 2023-03-13 MED ORDER — TIZANIDINE HCL 4 MG PO TABS: 4.0000 mg | ORAL_TABLET | Freq: Four times a day (QID) | ORAL | 0 refills | Status: AC | PRN

## 2023-03-13 MED ORDER — ACAMPROSATE CALCIUM 333 MG PO TBEC: 666.0000 mg | DELAYED_RELEASE_TABLET | Freq: Three times a day (TID) | ORAL | 11 refills | Status: AC

## 2023-03-13 MED ORDER — PEPCID COMPLETE 10-800-165 MG PO CHEW: 1.0000 | CHEWABLE_TABLET | Freq: Every day | ORAL | 11 refills | Status: AC | PRN

## 2023-03-13 NOTE — Progress Notes (Signed)
[place]  Phone: [phone]  New patient visit  Visit Date: 03/13/2023 Patient: Kyle Palmer   DOB: 02/07/1961   62 y.o. Male  MRN: [id number] PCP:  Loralee Pacas, MD  (establishing care today)  [place] Provider: Loralee Pacas, MD   Assessment and Plan:   This initial visit focused on establishing a primary care relationship and gathering a comprehensive medical history. We addressed key concerns and prioritized next  steps.   He is primarily interested in meet-and-greet today for establishing / transferring care.  For any remaining concerns, we encourage Mr.Meares to schedule a follow-up appointment at their earliest convenience.  To ensure optimal health maintenance and a complete medical record, we also recommend scheduling an Annual Preventive Medicine and Wellness Visit (APMWV) within the next year.  Work-related stress Assessment & Plan: Encouraged patient to have his therapist send me recommendations. Encouraged patient to to exercise more He declined medication(s) that were offered and is not depressed by it although it has stressed marriage and financial and social ties which is distressing   Alcohol use Assessment & Plan: Doesn't seem to be a serious problem, but I explained this is not a good way to manage stress  Offered naltrexone- "I would not take that" he doesn't want to stop enjoying etoh Suspect acamprosate will help    Orders: -     Acamprosate Calcium; Take 2 tablets (666 mg total) by mouth 3 (three) times daily with meals.  Dispense: 180 tablet; Refill: 11  Macrocytosis without anemia  Gastroesophageal reflux disease without esophagitis Assessment & Plan: Encouraged patient to to shift to pepcid complete which works faster than Nexium. Also advised patient to consider relationship with alcohol types.  Orders: -     Pepcid Complete; Chew 1 tablet by mouth daily as needed.  Dispense: 100 tablet;  Refill: 11  Leg cramps Assessment & Plan: Tried to offer alternative to zanaflex for insomnia but he prefers to use that since he has used in past.  Orders: -     tiZANidine HCl; Take 1 tablet (4 mg total) by mouth every 6 (six) hours as needed for muscle spasms.  Dispense: 30 tablet; Refill: 0  Financial insecurity due to debt  Benign essential hypertension -     CBC; Future -     Differential; Future -     Comprehensive metabolic panel; Future -     Hemoglobin A1c; Future  Overweight -     TSH; Future -     Hemoglobin A1c; Future -     Lipid panel  Benign prostatic hyperplasia with urinary frequency -     PSA; Future  Hyperlipidemia, unspecified hyperlipidemia type -     Lipid panel       Subjective:   Mr.Blincoe presents today with intent to establish care with Loralee Pacas, MD as his Primary Care Provider (PCP) going forward.   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  Lab Results  Component Value Date   HGB 14.4 01/05/2022   Lab Results  Component Value Date   [id number] 01/07/2022   No components found for: "MMA" Lab Results  Component Value Date   FOLATE 11.9 01/07/2022   Lab Results  Component Value Date   TSH 1.44 11/22/2018   TSH 1.77 04/03/2014   TSH 1.29 04/13/2010   No results found for: "FREET4" No results found for: "SPEP" No results  found for: "UPEP"    Financial Insecurity Due to Debt   Current payroll is 100K weekly at his business so its stressful to ensure this occurs Has to take loans with his name on sometimes.   Leg Cramps   He has difficulty falling asleep without zanaflex   GERD   Takes as needed nexium    Hyperlipidemia   Lipid Panel     Component Value Date/Time   CHOL 197 01/05/2022 0749   TRIG 168.0 (H) 01/05/2022 0749   HDL 43.70 01/05/2022 0749   CHOLHDL 5 01/05/2022 0749   VLDL 33.6 01/05/2022 0749   LDLCALC 120 (H) 01/05/2022 0749   LDLDIRECT 146.1 04/13/2010 0839  The 10-year ASCVD risk score (Arnett DK, et al., 2019) is: 9.1%   Values used to calculate the score:     Age: 49 years     Sex: Male     Is Non-Hispanic African American: No     Diabetic: No     Tobacco smoker: No     Systolic Blood Pressure: [id number] mmHg     Is BP treated: No     HDL Cholesterol: 43.7 mg/dL     Total Cholesterol: 197 mg/dL On simvastatin 20        Depression Screen    03/13/2023    8:46 AM 02/16/2022    8:28 AM 01/07/2022    8:35 AM  PHQ 2/9 Scores  PHQ - 2 Score 4 0 2  PHQ- 9 Score 10 1 6    No results found for any visits on 03/13/23.  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 No pertinent surgical history. Family History  Problem Relation Age of Onset   Colon cancer Mother 58       history on NMSC   Hypertension Father    Parkinson's disease Father    High Cholesterol Father    Other Father        pancreatic tumor; not cancer   Cancer Maternal Grandmother 18   Cancer Maternal Grandfather 90   Pancreatic cancer Paternal Grandfather    Outpatient Medications  Prior to Visit  Medication Sig Dispense Refill   esomeprazole (NEXIUM) 40 MG capsule Take 40 mg by mouth daily as needed.     simvastatin (ZOCOR) 40 MG tablet TAKE 1 TABLET BY MOUTH EVERYDAY AT BEDTIME 90 tablet 0   folic acid (FOLVITE) 1 MG tablet TAKE 1 TABLET BY MOUTH EVERY DAY 30 tablet 0   Methylcobalamin 1000 MCG SUBL Place 1,000 mcg under the tongue daily. 90 tablet  1   No facility-administered medications prior to visit.    No Known Allergies Social History   Tobacco Use   Smoking status: Former   Smokeless tobacco: Never  Vaping Use   Vaping Use: Never used  Substance Use Topics   Alcohol use: Yes   Drug use: No    Immunization History  Administered Date(s) Administered   Influenza,inj,Quad PF,6+ Mos 10/25/2018, 10/30/2019, 09/22/2020   Influenza-Unspecified 10/18/2021   Janssen (J&J) SARS-COV-2 Vaccination 02/24/2020, 10/19/2020   PFIZER(Purple Top)SARS-COV-2 Vaccination 09/18/2021   Td 12/19/2005, 11/03/2006   Tdap 10/25/2018    Objective:  Physical Exam  BP 110/88 (BP Location: Left Arm, Patient Position: Sitting)   Pulse 70   Temp 98.2 F (36.8 C) (Temporal)   Ht 5\' 10"  (1.778 m)   Wt 183 lb 12.8 oz (83.4 kg)   SpO2 99%   BMI 26.37 kg/m  Wt Readings from Last 10 Encounters:  03/13/23 183 lb 12.8 oz (83.4 kg)  02/16/22 186 lb (84.4 kg)  01/07/22 187 lb (84.8 kg)  09/22/20 191 lb 9.6 oz (86.9 kg)  03/11/20 187 lb 6.4 oz (85 kg)  12/24/18 188 lb 6.4 oz (85.5 kg)  11/22/18 195 lb 11.2 oz (88.8 kg)  06/11/18 196 lb 6.4 oz (89.1 kg)  03/06/17 194 lb 6 oz (88.2 kg)  10/20/16 190 lb 8 oz (86.4 kg)   Vital signs reviewed.  Nursing notes reviewed. Weight trend reviewed. Abnormalities and Problem-Specific physical exam findings:  very intelligent/insightful, does not seem frazzled at all by the stressful situation he describes General Appearance:  Well developed, well nourished, well-groomed, healthy-appearing male with Body mass index is 26.37 kg/m. No  acute distress appreciable.   Skin: Clear and well-hydrated. Pulmonary:  Normal work of breathing at rest, no respiratory distress apparent. SpO2: 99 %  Musculoskeletal:  Patient demonstrates smooth and coordinated movements throughout all major joints. All extremities are intact.  Neurological:  Awake, alert, oriented, and engaged.  No obvious focal neurological deficits or cognitive impairments.  Sensorium seems unclouded. Gait is smooth and coordinated Psychiatric:  Appropriate mood, pleasant and cooperative demeanor, cheerful and engaged during the exam  Results Reviewed (reviewed labs/imaging may be also be found in the assessment / plan section):    Results for orders placed or performed in visit on 01/07/22  IBC + Ferritin  Result Value Ref Range   Iron 111 42 - 165 ug/dL   Transferrin 337.0 212.0 - 360.0 mg/dL   Saturation Ratios 23.5 20.0 - 50.0 %   Ferritin 300.2 22.0 - 322.0 ng/mL   TIBC 471.8 (H) 250.0 - 450.0 mcg/dL  Methylmalonic acid, serum  Result Value Ref Range   Methylmalonic Acid, Quant 77 (L) 87 - 318 nmol/L  Homocysteine  Result Value Ref Range   Homocysteine 14.9 (H) <11.4 umol/L  Folate  Result Value Ref Range   Folate 11.9 >5.9 ng/mL  Vitamin B12  Result Value Ref Range   Vitamin B-12 494 211 - 911 pg/mL  PSA  Result Value Ref Range   PSA 0.40 0.10 - 4.00 ng/mL

## 2023-03-13 NOTE — Assessment & Plan Note (Signed)
Encouraged patient to to shift to pepcid complete which works faster than Nexium. Also advised patient to consider relationship with alcohol types.

## 2023-03-13 NOTE — Assessment & Plan Note (Addendum)
Doesn't seem to be a serious problem, but I explained this is not a good way to manage stress  Offered naltrexone- "I would not take that" he doesn't want to stop enjoying etoh Suspect acamprosate will help

## 2023-03-13 NOTE — Assessment & Plan Note (Addendum)
Encouraged patient to have his therapist send me recommendations. Encouraged patient to to exercise more He declined medication(s) that were offered and is not depressed by it although it has stressed marriage and financial and social ties which is distressing

## 2023-03-13 NOTE — Addendum Note (Signed)
Addended by: Loralee Pacas on: 03/13/2023 07:19 PM   Modules accepted: Level of Service

## 2023-03-13 NOTE — Patient Instructions (Addendum)
It was a pleasure seeing you today!  Your health and satisfaction are my top priorities. If you believe your experience today was worthy of a 5-star rating, I'd be grateful for your feedback! Loralee Pacas, MD   CHECKOUT CHECKLIST  []    Schedule next appointment(s):    Return in about 3 months (around 06/13/2023).  Any requested lab visits should be scheduled as appointments too  If you are not doing well:  Return to the office sooner Please bring all your medicine bottles to each appointment If your condition begins to worsen or become severe:  go to the emergency room or even call 911  []    Sign release of information authorizations: Any records we need for your care and to be your medical home  REMINDERS:  []   (Optional):  Review your clinical notes on MyChart after they are completed.     Today's draft of the physician documented plan for today's visit: (final revisions will be visible on MyChart chart later) Work-related stress Assessment & Plan: Encouraged patient to have his therapist send me recommendations. Encouraged patient to to exercise more   Alcohol use Assessment & Plan: Offered naltrexone- "I would not take that" he doesn't want to stop enjoying etoh Suspect acamprosate will help    Orders: -     Acamprosate Calcium; Take 2 tablets (666 mg total) by mouth 3 (three) times daily with meals.  Dispense: 180 tablet; Refill: 11  Macrocytosis without anemia  Gastroesophageal reflux disease without esophagitis Assessment & Plan: Encouraged patient to to shift to pepcid complete which works faster than Nexium. Also advised patient to consider relationship with alcohol types.  Orders: -     Pepcid Complete; Chew 1 tablet by mouth daily as needed.  Dispense: 100 tablet; Refill: 11  Leg cramps -     tiZANidine HCl; Take 1 tablet (4 mg total) by mouth every 6 (six) hours as needed for muscle spasms.  Dispense: 30 tablet; Refill: 0  Financial insecurity due to  debt  Benign essential hypertension -     CBC; Future -     Differential; Future -     Comprehensive metabolic panel; Future -     Hemoglobin A1c; Future  Overweight -     TSH; Future -     Hemoglobin A1c; Future -     Lipid panel  Benign prostatic hyperplasia with urinary frequency -     PSA; Future  Hyperlipidemia, unspecified hyperlipidemia type -     Lipid panel      QUESTIONS & CONCERNS: CLINICAL: please contact us via phone [phone] OR MyChart messaging  LAB & IMAGING:   We will call you if the results are significantly abnormal or you don't use MyChart.  Most normal results will be posted to MyChart immediately and have a clinical review message by Dr. Randol Kern posted within 2-3 business days.   If you have not heard from [place] regarding the results in 2 weeks OR if you need priority reporting, please contact this office. MYCHART:  The fastest way to get your results and easiest way to stay in touch with [place] is by activating your My Chart account. Instructions are located on the last page of this paperwork.  BILLING: xray and lab orders are billed from separate companies and questions./concerns should be directed to the [place].  For visit charges please discuss with our administrative services COMPLAINTS:  please let Dr. Randol Kern know or see the [facility], by asking at the front desk: we want you to be satisfied with every experience and we would be grateful for the opportunity to address any problems

## 2023-03-13 NOTE — Assessment & Plan Note (Signed)
Tried to offer alternative to zanaflex for insomnia but he prefers to use that since he has used in past.

## 2023-03-14 ENCOUNTER — Other Ambulatory Visit (INDEPENDENT_AMBULATORY_CARE_PROVIDER_SITE_OTHER): Payer: 59

## 2023-03-14 ENCOUNTER — Encounter: Payer: Self-pay | Admitting: Internal Medicine

## 2023-03-14 DIAGNOSIS — N401 Enlarged prostate with lower urinary tract symptoms: Secondary | ICD-10-CM

## 2023-03-14 DIAGNOSIS — R350 Frequency of micturition: Secondary | ICD-10-CM | POA: Diagnosis not present

## 2023-03-14 DIAGNOSIS — N182 Chronic kidney disease, stage 2 (mild): Secondary | ICD-10-CM | POA: Insufficient documentation

## 2023-03-14 DIAGNOSIS — I10 Essential (primary) hypertension: Secondary | ICD-10-CM

## 2023-03-14 DIAGNOSIS — E663 Overweight: Secondary | ICD-10-CM

## 2023-03-14 LAB — COMPREHENSIVE METABOLIC PANEL
ALT: 19 U/L (ref 0–53)
AST: 16 U/L (ref 0–37)
Albumin: 4.4 g/dL (ref 3.5–5.2)
Alkaline Phosphatase: 85 U/L (ref 39–117)
BUN: 12 mg/dL (ref 6–23)
CO2: 30 mEq/L (ref 19–32)
Calcium: 9.6 mg/dL (ref 8.4–10.5)
Chloride: 101 mEq/L (ref 96–112)
Creatinine, Ser: 1.03 mg/dL (ref 0.40–1.50)
GFR: 77.44 mL/min (ref >60.00)
Glucose, Bld: 96 mg/dL (ref 70–99)
Potassium: 4.8 mEq/L (ref 3.5–5.1)
Sodium: 139 mEq/L (ref 135–145)
Total Bilirubin: 0.5 mg/dL (ref 0.2–1.2)
Total Protein: 7 g/dL (ref 6.0–8.3)

## 2023-03-14 LAB — CBC WITH DIFFERENTIAL/PLATELET
Basophils Absolute: 0.1 10*3/uL (ref 0.0–0.1)
Basophils Relative: 1.1 % (ref 0.0–3.0)
Eosinophils Absolute: 0.4 10*3/uL (ref 0.0–0.7)
Eosinophils Relative: 5.8 % — ABNORMAL HIGH (ref 0.0–5.0)
HCT: 40.4 % (ref 39.0–52.0)
Hemoglobin: 14.4 g/dL (ref 13.0–17.0)
Lymphocytes Relative: 29.4 % (ref 12.0–46.0)
Lymphs Abs: 1.9 10*3/uL (ref 0.7–4.0)
MCHC: 35.7 g/dL (ref 30.0–36.0)
MCV: 101.1 fl — ABNORMAL HIGH (ref 78.0–100.0)
Monocytes Absolute: 0.7 10*3/uL (ref 0.1–1.0)
Monocytes Relative: 11.3 % (ref 3.0–12.0)
Neutro Abs: 3.3 10*3/uL (ref 1.4–7.7)
Neutrophils Relative %: 52.4 % (ref 43.0–77.0)
Platelets: 449 10*3/uL — ABNORMAL HIGH (ref 150.0–400.0)
RBC: 4 Mil/uL — ABNORMAL LOW (ref 4.22–5.81)
RDW: 13.2 % (ref 11.5–15.5)
WBC: 6.4 10*3/uL (ref 4.0–10.5)

## 2023-03-14 LAB — TSH: TSH: 1.91 u[IU]/mL (ref 0.35–5.50)

## 2023-03-14 LAB — HEMOGLOBIN A1C: Hgb A1c MFr Bld: 5.9 % (ref 4.6–6.5)

## 2023-03-14 LAB — PSA: PSA: 0.42 ng/mL (ref 0.10–4.00)

## 2023-03-14 NOTE — Progress Notes (Signed)
Send out chronic kidney disease handout

## 2023-03-21 ENCOUNTER — Encounter: Payer: 59 | Admitting: Internal Medicine

## 2023-03-23 ENCOUNTER — Encounter: Payer: 59 | Admitting: Internal Medicine

## 2023-04-05 ENCOUNTER — Ambulatory Visit (INDEPENDENT_AMBULATORY_CARE_PROVIDER_SITE_OTHER): Payer: 59 | Admitting: Internal Medicine

## 2023-04-05 ENCOUNTER — Encounter: Payer: Self-pay | Admitting: Internal Medicine

## 2023-04-05 VITALS — BP 128/80 | HR 77 | Temp 98.3°F | Ht 70.0 in | Wt 184.0 lb

## 2023-04-05 DIAGNOSIS — N401 Enlarged prostate with lower urinary tract symptoms: Secondary | ICD-10-CM

## 2023-04-05 DIAGNOSIS — I10 Essential (primary) hypertension: Secondary | ICD-10-CM

## 2023-04-05 DIAGNOSIS — E785 Hyperlipidemia, unspecified: Secondary | ICD-10-CM | POA: Diagnosis not present

## 2023-04-05 DIAGNOSIS — Z86010 Personal history of colonic polyps: Secondary | ICD-10-CM

## 2023-04-05 DIAGNOSIS — R350 Frequency of micturition: Secondary | ICD-10-CM

## 2023-04-05 DIAGNOSIS — K219 Gastro-esophageal reflux disease without esophagitis: Secondary | ICD-10-CM

## 2023-04-05 DIAGNOSIS — Z800 Family history of malignant neoplasm of digestive organs: Secondary | ICD-10-CM

## 2023-04-05 NOTE — Progress Notes (Signed)
[facility]: [phone] Provider: Lula Olszewski, MD   Chief Complaint:  Kyle Palmer is a 62 y.o. assigned male at birth who presents today for his annual comprehensive physical exam.    Medical assistant also reports: Chief Complaint  Patient presents with  . Annual Exam    Assessment/Plan:   Kyle Palmer was seen today for annual exam.  Gastroesophageal reflux disease without esophagitis Overview: Takes as needed nexium    Benign essential hypertension  Benign prostatic hyperplasia with urinary frequency  Hyperlipidemia, unspecified hyperlipidemia type Overview: Lipid Panel     Component Value Date/Time   CHOL 197 01/05/2022 0749   TRIG 168.0 (H) 01/05/2022 0749   HDL 43.70 01/05/2022 0749   CHOLHDL 5 01/05/2022 0749   VLDL 33.6 01/05/2022 0749   LDLCALC 120 (H) 01/05/2022 0749   LDLDIRECT 146.1 04/13/2010 0839   The 10-year ASCVD risk score (Arnett DK, et al., 2019) is: 9.1%   Values used to calculate the score:     Age: 38 years     Sex: Male     Is Non-Hispanic African American: No     Diabetic: No     Tobacco smoker: No     Systolic Blood Pressure: 110 mmHg     Is BP treated: No     HDL Cholesterol: 43.7 mg/dL     Total Cholesterol: 197 mg/dL On simvastatin 20         Today's Health Maintenance Counseling and Anticipatory Guidance:   Eye exams:  every 1-2 years recommended.  Having vision corrected can improve the quality of day-to-day life.  Eye specialists can detect certain eye conditions such as cataracts, glaucoma and age-related macular degeneration, which could lead to sight loss.  They can also detect certain rare cancers and diabetes, among other things.  Kyle Palmer reports his last eye exam was:  just had it done Dental health: Discussed importance of regular tooth brushing, flossing, and dental visits q6 months.  Poor dentition can lead to serious medical problems - particularly problems with heart  valves.  Kyle Palmer reports he has been keeping up with his dental visits.  He intends to do so in the future.  Sinus health: Encourage sterile saline nasal misting sinus rinses daily for pollen, to reduce allergies and risk for sinus infections.   Sterile can based misting products are recommended due to superior misting and ease of maintaining sterility. Sleep Apnea screening:  He  denies any significant problems with sleep quality or hypersomnolence or being advised that he has been told that he snores or has apnea STOP-Bang Score Do you snore loudly?: No Do you often feel tired, fatigued, or sleepy during the daytime?: No Has anyone observed you stop breathing during sleep?: No Do you have (or are you being treated for) high blood pressure?: borderline BMI    Body Mass Index: 26.40 kg/m    @ Is BMI greater than 35 kg/m2?:  No Age older than 64 years old?: Yes Has large neck size > 40 cm (15.7 in, large male shirt size, large male collar size > 16): No Gender Male?:  Yes STOP-Bang Total Score(total yes answers):  3  Cardiovascular Risk Factor Reduction:   Advised patient of need for regular exercise and diet rich and fruits and vegetables and healthy fats to reduce risk of heart attack and stroke.  Avoid first- and second-hand smoke and stimulants.   Avoid extreme exercise- exercise in moderation (150 minutes per week is a  good goal) Wt Readings from Last 3 Encounters:  04/05/23 184 lb (83.5 kg)  03/13/23 183 lb 12.8 oz (83.4 kg)  02/16/22 186 lb (84.4 kg)   Body mass index is 26.4 kg/m. / overweight.  Waist circumference 34" doesn't seem overweight. He reports his diet consists of lean meats and fish and veggies He reports his exercise includes of lots of walking 2x weekly 6-10000 steps Health maintenance and immunizations reviewed and he was encouraged to complete anything that is due: Immunization History  Administered Date(s) Administered  . Influenza,inj,Quad PF,6+ Mos  10/25/2018, 10/30/2019, 09/22/2020  . Influenza-Unspecified 10/18/2021  . Janssen (J&J) SARS-COV-2 Vaccination 02/24/2020, 10/19/2020  . PFIZER(Purple Top)SARS-COV-2 Vaccination 09/18/2021  . Td 12/19/2005, 11/03/2006  . Tdap 10/25/2018   There are no preventive care reminders to display for this patient.  Sexual transmitted infection screening: testing offered today, but patient declined as he feels he is low risk based on his sexual history declined    Substance use:  I discussed that my recommendation is total abstinence from all substances of abuse including smoke and 2nd hand smoke, alcohol, illicit drugs, smoking, inhalants, sugar.   Offered to assist with any use disorders or addictions.  Just alcohol, "don't do anything else" he feels alcohol use is a concern.;4-5/10 interest in stopping, using it to self medicate doesn't like that but stress is insane. Injury prevention: Discussed safety belts, safety helmets, smoke detectors. Cancer Screening: Penile cancer screening: Asked about genital warts or tumors/abnormalities of penis. Recommended Gardasil if none present, or cryo ablation if present Testicular cancer screening:  Patient was advised to palpate testicles, scrotum, penis for masses and inform me of any.   Thyroid cancer screening: patient advised to check by palpating thyroid for nodules Prostate cancer screening:  Denies family history of prostate cancer or hematospermia so too young for screening by current guidelines. Lab Results  Component Value Date   PSA 0.42 03/14/2023   PSA 0.40 01/07/2022   PSA 0.42 11/22/2018       Colon cancer screening:    Denies strong family history of colon cancer or blood in stool so no screening is indicated until age 78.   Lung cancer screening:  current guidelines recommend Individuals aged 43 to 37 who currently smoke or formerly smoked and have a = 20 pack-year smoking history should undergo annual screening with low-dose computed  tomography (LDCT). He smoked early 20's to 28 then stopped and no cigarettes since. Never have had coughing up blood.  Doesn't meet criteria for screening. Skin cancer screening-  Advised regular sunscreen use. He denies worrisome, changing, or new skin lesions. Showed him pictures of melanomas for reference: he will see derm in 2 week.     Return to care in 1 year for next preventative visit.   Lula Olszewski, MD     Subjective:   See problem-oriented charting in (overview sections of assessment & plan) for updated chart information added to chronic problems for future tracking  He has no acute complaints today.   ROS   I attest that I have reviewed and confirmed the patients current medications to meet the medication reconciliation requirement  he hasn't taken acamprosate. Current Outpatient Medications  Medication Sig Dispense Refill  . acamprosate (CAMPRAL) 333 MG tablet Take 2 tablets (666 mg total) by mouth 3 (three) times daily with meals. 180 tablet 11  . esomeprazole (NEXIUM) 40 MG capsule Take 40 mg by mouth daily as needed.    . famotidine-calcium carbonate-magnesium  hydroxide (PEPCID COMPLETE) 10-800-165 MG chewable tablet Chew 1 tablet by mouth daily as needed. 100 tablet 11  . simvastatin (ZOCOR) 40 MG tablet TAKE 1 TABLET BY MOUTH EVERYDAY AT BEDTIME 90 tablet 0  . tiZANidine (ZANAFLEX) 4 MG tablet Take 1 tablet (4 mg total) by mouth every 6 (six) hours as needed for muscle spasms. 30 tablet 0   No current facility-administered medications for this visit.    The following were reviewed and entered/updated in epic:    04/05/2023    4:11 PM  Depression screen PHQ 2/9  Decreased Interest 1  Down, Depressed, Hopeless 1  PHQ - 2 Score 2  Altered sleeping 0  Tired, decreased energy 0  Change in appetite 0  Feeling bad or failure about yourself  1  Trouble concentrating 1  Moving slowly or fidgety/restless 1  Suicidal thoughts 0  PHQ-9 Score 5  Difficult doing  work/chores Somewhat difficult   Past Medical History:  Diagnosis Date  . GERD (gastroesophageal reflux disease)   . Hyperlipidemia   . Squamous cell carcinoma of skin 03/06/2020   in situ on right forehead - CX3+5FU  . 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 Chronic kidney disease, stage 2 (mild) 03/14/2023  . Work-related stress 03/13/2023  . Alcohol use 03/13/2023  . Macrocytosis without anemia 03/13/2023  . Financial insecurity due to debt 03/13/2023  . Leg cramps 03/13/2023  . Previous back surgery 12/08/2022  . Squamous cell carcinoma in situ (SCCIS) of skin of forehead 03/11/2020  . History of adenomatous polyp of colon 03/03/2019  . Family history of colon cancer in mother 03/03/2019  . Benign essential hypertension 06/11/2018  . BPH (benign prostatic hyperplasia) 06/20/2016  . GERD 11/27/2008  . Hyperlipidemia 07/24/2007   History reviewed. No pertinent surgical history. Family History  Problem Relation Age of Onset  . Colon cancer Mother 50       history on NMSC  . Hypertension Father   . Parkinson's disease Father   . High Cholesterol Father   . Other Father        pancreatic tumor; not cancer  . Cancer Maternal Grandmother 90  . Cancer Maternal Grandfather 90  . Pancreatic cancer Paternal Grandfather    No Known Allergies Social History   Tobacco Use  . Smoking status: Former  . Smokeless tobacco: Never  Vaping Use  . Vaping Use: Never used  Substance Use Topics  . Alcohol use: Yes  . Drug use: No           Objective:  Physical Exam: BP 128/80 (BP Location: Left Arm, Patient Position: Sitting)   Pulse 77   Temp 98.3 F (36.8 C) (Temporal)   Ht 5\' 10"  (1.778 m)   Wt 184 lb (83.5 kg)   SpO2 97%   BMI 26.40 kg/m   Body mass index is 26.4 kg/m. Wt Readings from Last 3 Encounters:  04/05/23 184 lb (83.5 kg)  03/13/23 183 lb 12.8  oz (83.4 kg)  02/16/22 186 lb (84.4 kg)   Gen: NAD, resting comfortably*** HEENT: TMs normal bilaterally. OP clear. No thyromegaly noted.  CV: RRR with no murmurs appreciated Pulm: NWOB, CTAB with no crackles, wheezes, or rhonchi GI: Normal bowel sounds present. Soft, Nontender, Nondistended. MSK: no edema, cyanosis, or clubbing noted Skin: warm, dry Neuro: CN2-12 grossly intact. Strength 5/5 in upper and lower extremities.  Reflexes symmetric and intact bilaterally.  Psych: Normal affect and thought content

## 2023-04-06 MED ORDER — SIMVASTATIN 40 MG PO TABS: 40.0000 mg | ORAL_TABLET | Freq: Every day | ORAL | 3 refills | Status: DC

## 2023-07-25 ENCOUNTER — Inpatient Hospital Stay: Payer: 59 | Admitting: Genetic Counselor

## 2023-07-25 ENCOUNTER — Encounter: Payer: Self-pay | Admitting: Genetic Counselor

## 2023-07-25 ENCOUNTER — Inpatient Hospital Stay: Payer: 59

## 2023-07-25 DIAGNOSIS — Z800 Family history of malignant neoplasm of digestive organs: Secondary | ICD-10-CM

## 2023-07-25 DIAGNOSIS — Z801 Family history of malignant neoplasm of trachea, bronchus and lung: Secondary | ICD-10-CM | POA: Diagnosis not present

## 2023-07-25 NOTE — Progress Notes (Unsigned)
REFERRING PROVIDER: Lula Olszewski, MD [address] [place],  [place] [address]  PRIMARY PROVIDER:  Lula Olszewski, MD  PRIMARY REASON FOR VISIT:  Encounter Diagnoses  Name Primary?   Family history of pancreatic cancer Yes   Family history of colon cancer    HISTORY OF PRESENT ILLNESS:   Mr. Cluster, a 62 y.o. male, was seen for a [place] cancer genetics consultation at the request of Dr. Jon Billings due to a family history of cancer.  Mr. Vogelsang presents to clinic today to discuss the possibility of a hereditary predisposition to cancer, to discuss genetic testing, and to further clarify his future cancer risks, as well as potential cancer risks for family members.   Mr. Blakenship is a 62 y.o. male with a personal history of squamous cell carcinoma in situ. He does not have any additional personal history of cancer.   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     Social History   Socioeconomic History   Marital status: Married    Spouse name: Not on file   Number of children: Not on file   Years of education: Not on file   Highest education level: Not on file  Occupational History   Occupation: business owner  Tobacco Use   Smoking status: Former   Smokeless tobacco: Never  Advertising account planner   Vaping status: Never Used  Substance and Sexual Activity   Alcohol use: Yes   Drug use: No   Sexual activity: Not on file  Other Topics Concern   Not on file  Social History Narrative   Not on file   Social Determinants of Health   Financial Resource Strain: Not on file  Food Insecurity: Not on file  Transportation Needs: Not on file  Physical Activity: Not on file  Stress: Not on file  Social Connections: Not on file      FAMILY HISTORY:  We obtained a detailed, 4-generation family history.  Significant diagnoses are listed below: Family History  Problem Relation Age of Onset   Colon cancer Mother 50       history on NMSC   Hypertension Father    High Cholesterol Father    Other Father 57       pancreatic tumor; not cancer   Colon cancer Maternal Grandmother 52   Lung cancer Maternal Grandfather 28   Pancreatic cancer Paternal Grandfather 75 - 76     Mr. Audet mother was diagnosed with colon cancer at age 15 and his maternal grandmother was diagnosed with colon cancer at age 24, she died at age 86. His maternal grandfather was diagnosed with lung cancer at age 86, he smoked and died at age 52. Mr. Lachat paternal grandfather was diagnosed with pancreatic cancer in his 42s, he died in his 35s. Mr. Moodie is unaware of previous family history of genetic testing for hereditary cancer risks. There both maternal and paternal reported Ashkenazi Jewish ancestry.   GENETIC COUNSELING ASSESSMENT: Mr. Yildiz is a 62 y.o. male with a family history of cancer which is somewhat suggestive of a hereditary predisposition to cancer given his family history of pancreatic cancer and Ashkenazi Jewish ancestry. We, therefore, discussed and recommended the following at today's visit.   DISCUSSION:  We discussed that 5 - 10% of cancer is hereditary, with most cases of hereditary pancreatic cancer associated with BRCA1/2.  There are other genes that can be associated with hereditary pancreatic cancer syndromes.  We discussed that testing is beneficial for several reasons, including knowing about other cancer risks, identifying potential screening and risk-reduction options that may be appropriate, and to understanding if other family members could be at risk for cancer and allowing them to undergo genetic testing.  We reviewed the characteristics, features and inheritance patterns of hereditary cancer  syndromes. We also discussed genetic testing, including the appropriate family members to test, the process of testing, insurance coverage and turn-around-time for results. We discussed the implications of a negative, positive, carrier and/or variant of uncertain significant result. We recommended Mr. Covill pursue genetic testing for a panel that contains genes associated with pancreatic and colon cancer.  Mr. Wentworth was offered a common hereditary cancer panel (34 genes) and an expanded pan-cancer panel (71 genes). Mr. Kuyper was informed of the benefits and limitations of each panel, including that expanded pan-cancer panels contain several genes that do not have clear management guidelines at this point in time.  We also discussed that as the number of genes included on a panel increases, the chances of variants of uncertain significance increases.  After considering the benefits and limitations of each gene panel, Mr. Debaker elected to have ***.   Based on Mr. Heaster family history of cancer, he meets medical criteria for genetic testing. Despite that he meets criteria, he may still have an out of pocket cost. We discussed that if his out of pocket cost for testing is over $100, the laboratory will call and confirm whether he wants to proceed with testing.  If the out of pocket cost of testing is less than $100 he will be billed by the genetic testing laboratory.   We discussed that some people do not want to undergo genetic testing due to fear of genetic discrimination.  A federal law called the Genetic Information Non-Discrimination Act (GINA) of 2008 helps protect individuals against genetic discrimination based on their genetic test results.  It impacts both health insurance and employment.  With health insurance, it protects against increased premiums, being kicked off insurance or being forced to take a test in order to be insured.  For employment it protects against hiring,  firing and promoting decisions based on genetic test results.  GINA does not apply to those in the [employer], those who work for companies with less than 15 employees, and new life insurance or long-term disability insurance policies.  Health status due to a cancer diagnosis is not protected under GINA.  PLAN: After considering the risks, benefits, and limitations, Mr. Ty provided informed consent to pursue genetic testing and the blood sample will be drawn on 07/31/2023 and sent to [facility] for analysis of the {test}. Results should be available within approximately 2-3 weeks' time, at which point they will be disclosed by telephone to Mr. Irene, as will any additional recommendations warranted by these results. Mr. Honan will receive a summary of his genetic counseling visit and a copy of his results once available. This information will also be available in Epic.   Mr. Palomares questions were answered to his satisfaction today. Our contact information was provided should additional questions or concerns arise. Thank you for the referral and allowing [place] to share in the care of your patient.   Lalla Brothers, MS, [facility] Genetic Counselor [email or web address] (P)  [phone]  The patient was seen for a total of 40 minutes in video genetic counseling. The patient was seen alone.  Drs. Pamelia Hoit and/or Mosetta Putt were available to discuss this case as needed.  _______________________________________________________________________ For Office Staff:  Number of people involved in session: 1 Was an Intern/ student involved with case: no

## 2023-07-31 ENCOUNTER — Inpatient Hospital Stay: Payer: 59

## 2023-09-27 ENCOUNTER — Ambulatory Visit (INDEPENDENT_AMBULATORY_CARE_PROVIDER_SITE_OTHER): Payer: 59 | Admitting: Internal Medicine

## 2023-09-27 ENCOUNTER — Encounter: Payer: Self-pay | Admitting: Internal Medicine

## 2023-09-27 VITALS — BP 106/80 | HR 75 | Temp 98.2°F | Ht 70.0 in | Wt 185.4 lb

## 2023-09-27 DIAGNOSIS — R7303 Prediabetes: Secondary | ICD-10-CM | POA: Diagnosis not present

## 2023-09-27 DIAGNOSIS — N182 Chronic kidney disease, stage 2 (mild): Secondary | ICD-10-CM | POA: Diagnosis not present

## 2023-09-27 DIAGNOSIS — D7589 Other specified diseases of blood and blood-forming organs: Secondary | ICD-10-CM

## 2023-09-27 DIAGNOSIS — R41840 Attention and concentration deficit: Secondary | ICD-10-CM

## 2023-09-27 DIAGNOSIS — E785 Hyperlipidemia, unspecified: Secondary | ICD-10-CM

## 2023-09-27 DIAGNOSIS — I10 Essential (primary) hypertension: Secondary | ICD-10-CM

## 2023-09-27 DIAGNOSIS — Z9889 Other specified postprocedural states: Secondary | ICD-10-CM

## 2023-09-27 DIAGNOSIS — Z789 Other specified health status: Secondary | ICD-10-CM

## 2023-09-27 DIAGNOSIS — F908 Attention-deficit hyperactivity disorder, other type: Secondary | ICD-10-CM | POA: Diagnosis not present

## 2023-09-27 NOTE — Assessment & Plan Note (Signed)
His large red blood cells are likely due to an alcohol-induced B vitamin deficiency. We will start a daily B complex vitamin and recheck red blood cell size.

## 2023-09-27 NOTE — Patient Instructions (Addendum)
VISIT SUMMARY:  During our appointment, we discussed your concerns about your increasing alcohol consumption, potential ADHD, and your family history of dementia. We also talked about your physical health, including night sweats, a history of leg cramps, and a non-cancerous skin growth. You expressed a desire to reduce your alcohol intake and introduce regular exercise into your routine. We have developed a plan to address these issues.  YOUR PLAN:  -ALCOHOL USE DISORDER: You are consuming a significant amount of alcohol weekly, which is affecting your health. We will work on a plan to reduce your alcohol intake to 15 drinks per week over the next 8 weeks. We may also consider a medication called Campral to help reduce cravings. This medication can help you manage your desire to drink alcohol.  -ADHD: You have discussed the possibility of having ADHD with your therapist. To confirm this, we will refer you for a formal ADHD evaluation. This evaluation will help [place] understand if you have ADHD and what treatment options would be best for you.  -CHRONIC KIDNEY DISEASE: Your kidneys are showing signs of age-related decline. We discussed the potential use of medications like Jardiance or Farxiga to slow this progression. We will also monitor your kidney function through regular lab tests. These medications can help protect your kidneys from further damage.  -PREDIABETES: Your A1C level, a measure of your average blood sugar over the past 3 months, is slightly high. This suggests you may have prediabetes, which is often related to alcohol use. We will recheck your A1C and discuss lifestyle changes, including reducing alcohol intake, to help manage this condition.  -MACROCYTOSIS: Your red blood cells are larger than normal, likely due to a deficiency in B vitamins caused by alcohol use. We will start you on a daily B complex vitamin and recheck the size of your red blood cells. The B complex vitamin will help  your body produce healthy red blood cells.  INSTRUCTIONS:  We will order several lab tests to monitor your liver function, cholesterol, thyroid, and other health indicators. We also plan to schedule a colonoscopy in 2 years due to your history of polyps. It's also recommended that you have the non-cancerous skin lesion removed. Please continue to monitor your health and report any significant changes.    # Understanding Age-Related Chronic Kidney Disease (CKD)  ## What is CKD? Chronic Kidney Disease (CKD) is a condition where your kidneys gradually lose their ability to filter waste and excess fluids from your blood. As you age, the risk of developing CKD increases.  ## Your Kidney Function Your estimated glomerular filtration rate (eGFR) is in the 70s, which indicates mild kidney damage. This is common as we age, but it's important to monitor and manage to prevent further decline.  ## Managing Your CKD 1. Control blood pressure 2. Manage diabetes if applicable 3. Maintain a healthy diet low in sodium and rich in fruits, vegetables, and whole grains 4. Stay physically active 5. Avoid smoking 6. Limit alcohol intake 7. Take medications as prescribed  ## Medications: SGLT2 Inhibitors (Jardiance and Farxiga) SGLT2 inhibitors like Jardiance (empagliflozin) and Farxiga (dapagliflozin) are medications primarily used for diabetes treatment. However, recent studies have shown they may also provide benefits for people with CKD, even without diabetes:  - They may slow the progression of kidney disease - They can reduce the risk of heart-related complications - They may help lower blood pressure  Discuss with your doctor if these medications might be appropriate for you, considering your  overall health, other medications, and potential side effects.  ## Regular Monitoring Keep up with regular check-ups and lab tests to monitor your kidney function and overall health.  Remember, while  age-related changes in kidney function are common, maintaining a healthy lifestyle can help slow the progression of CKD and improve your overall well-being. --------------------------------------------      # 8-Week Alcohol Reduction Plan  ## Goal Reduce alcohol intake from approximately 30 drinks per week to 15 drinks per week over 8 weeks, with no drinking Sunday through Wednesday, and eventually including Thursday.  ## Weekly Schedule - Week 1-2: No drinking Sunday-Wednesday, max 7 drinks Thursday-Saturday - Week 3-4: No drinking Sunday-Wednesday, max 6 drinks Thursday-Saturday - Week 5-6: No drinking Sunday-Wednesday, max 5 drinks Thursday-Saturday - Week 7-8: No drinking Sunday-Thursday, max 5 drinks Friday-Saturday  ## Daily Limits - Thursday (Weeks 1-6): Max 2 drinks - Friday: Max 3 drinks - Saturday: Max 2 drinks  ## Strategies for Success 1. Keep a drink diary to track your progress 2. Find alternative activities for non-drinking days 3. Practice stress-management techniques 4. Inform friends and family about your plan for support 5. Remove alcohol from your home environment 6. Stay hydrated with non-alcoholic beverages  ## Using Campral (Acamprosate) Campral may help reduce alcohol cravings and support abstinence. It's typically used as follows:  - Dosage: Usually 666 mg three times daily - Duration: Continue even if you slip and have a drink - Effectiveness: It may take 5-8 days to reach full effectiveness - Side effects: May include diarrhea, dizziness, and nausea  Consult with your healthcare provider about using Campral as part of your reduction plan. They can provide a prescription and monitor your progress.  Remember, it's important to consult with a healthcare professional before starting this plan, especially if you've been drinking heavily, to ensure safe alcohol reduction and to discuss if Campral is appropriate for you.

## 2023-09-27 NOTE — Assessment & Plan Note (Signed)
He exhibits age-related kidney function decline without symptoms or complications. We discussed the potential use of Jardiance or Farxiga to slow the progression of kidney disease and will order labs to monitor kidney function.

## 2023-09-27 NOTE — Assessment & Plan Note (Signed)
Alcohol Use Disorder   He is consuming approximately 30 drinks weekly, leading to morning grogginess and concerns about cognitive decline. He has expressed a desire to reduce alcohol intake. We will implement a structured plan to decrease alcohol consumption to 15 drinks per week over 8 weeks and consider prescribing Campral to help reduce cravings.

## 2023-09-27 NOTE — Progress Notes (Signed)
[facility]: [phone]   -- Medical Office Visit --  Patient:  Kyle Palmer      Age: 62 y.o.       Sex:  male  Date:   09/27/2023 Patient Care Team: Lula Olszewski, MD as PCP - General (Internal Medicine) Today's Healthcare Provider: Lula Olszewski, MD      Assessment & Plan Alcohol use Alcohol Use Disorder   He is consuming approximately 30 drinks weekly, leading to morning grogginess and concerns about cognitive decline. He has expressed a desire to reduce alcohol intake. We will implement a structured plan to decrease alcohol consumption to 15 drinks per week over 8 weeks and consider prescribing Campral to help reduce cravings. Chronic kidney disease, stage 2 (mild) He exhibits age-related kidney function decline without symptoms or complications. We discussed the potential use of Jardiance or Farxiga to slow the progression of kidney disease and will order labs to monitor kidney function. Concentration deficit  Benign essential hypertension  Hyperlipidemia, unspecified hyperlipidemia type  Other specified attention deficit hyperactivity disorder (ADHD) He has discussed the possibility of medication for ADHD with his therapist but requires a formal diagnosis. We will refer him for a formal ADHD evaluation. Prediabetes His A1C level was 5.9% in March, indicating prediabetes, likely related to alcohol use. We will recheck the A1C and have discussed lifestyle modifications, including reducing alcohol intake. Macrocytosis without anemia His large red blood cells are likely due to an alcohol-induced B vitamin deficiency. We will start a daily B complex vitamin and recheck red blood cell size. We will order labs to include liver function, cholesterol, thyroid, homocysteine, methylmalonic acid, total iron binding content, and myeloma screen, along with a urinalysis due to chronic kidney disease. We plan a colonoscopy in 2 years due to a history of  polyps and encourage him to have a skin lesion removed.    Diagnoses and all orders for this visit: Alcohol use Chronic kidney disease, stage 2 (mild) -     Ambulatory referral to Neuropsychology -     CBC with Differential/Platelet; Future -     Comprehensive metabolic panel; Future -     HgB A1c; Future -     Lipid Panel w/reflex Direct LDL; Future -     Microalbumin / creatinine urine ratio; Future -     TSH Rfx on Abnormal to Free T4; Future -     Urinalysis, Routine w reflex microscopic; Future Concentration deficit Benign essential hypertension -     Ambulatory referral to Neuropsychology -     CBC with Differential/Platelet; Future -     Comprehensive metabolic panel; Future -     HgB A1c; Future -     Lipid Panel w/reflex Direct LDL; Future -     Microalbumin / creatinine urine ratio; Future -     TSH Rfx on Abnormal to Free T4; Future -     Urinalysis, Routine w reflex microscopic; Future Hyperlipidemia, unspecified hyperlipidemia type -     Ambulatory referral to Neuropsychology -     CBC with Differential/Platelet; Future -     Comprehensive metabolic panel; Future -     HgB A1c; Future -     Lipid Panel w/reflex Direct LDL; Future -     Microalbumin / creatinine urine ratio; Future -     TSH Rfx on Abnormal to Free T4; Future -     Urinalysis, Routine w reflex microscopic; Future Other specified attention deficit hyperactivity disorder (ADHD)  Prediabetes -     Ambulatory referral to Neuropsychology -     HgB A1c; Future -     Microalbumin / creatinine urine ratio; Future -     Urinalysis, Routine w reflex microscopic; Future Macrocytosis without anemia -     Protein Electrophoresis, (serum); Future  Recommended follow-up: No follow-ups on file.No future appointments.          Subjective   62 y.o. male who has Hyperlipidemia; GERD; BPH (benign prostatic hyperplasia); Benign essential hypertension; Squamous cell carcinoma in situ (SCCIS) of skin of forehead;  Previous back surgery; History of adenomatous polyp of colon; Family history of colon cancer in mother; Work-related stress; Alcohol use; Macrocytosis without anemia; Financial insecurity due to debt; Chronic kidney disease, stage 2 (mild); and Prediabetes on their problem list. His reasons/main concerns/chief complaints for today's office visit are 3-4  month follow-up   ------------------------------------------------------------------------------------------------------------------------ AI-Extracted: Discussed the use of AI scribe software for clinical note transcription with the patient, who gave verbal consent to proceed.  History of Present Illness   The patient, a business owner, presents with concerns about escalating alcohol consumption and its potential health implications. Over the past few months, the patient has noticed an increase in daily alcohol intake, currently averaging around 30 drinks per week, primarily consumed from Thursday to Saturday. The patient denies daytime drinking but acknowledges using alcohol as a sleep aid, leading to grogginess and sluggishness in the mornings. The patient expresses concern about the impact of this pattern on work performance and overall health.  The patient has been in discussion with a therapist about potential ADHD, and is considering medication to manage symptoms. However, a formal diagnosis has not yet been made. The patient is interested in exploring this further and understanding the process for obtaining a formal diagnosis.  The patient also reports a family history of dementia, which has prompted consideration of genetic testing. However, the patient has reservations about the potential implications for his children and has decided to postpone the test.  In terms of physical health, the patient has noticed an increase in night sweats, which he attributes to alcohol withdrawal. The patient also reports a history of leg cramps, which have since  resolved, and a recent skin check revealed a non-cancerous growth that the patient wishes to have removed.  The patient is motivated to address his alcohol consumption and is open to a structured plan to gradually reduce intake. The patient is also considering the introduction of regular exercise into his routine. The patient expresses a desire to maintain a level of alcohol consumption that allows enjoyment without compromising health, and is open to exploring medication options to support this goal.      He has a past medical history of GERD (gastroesophageal reflux disease), Hyperlipidemia, Leg cramps (03/13/2023), Squamous cell carcinoma of skin (03/06/2020), and Work-related stress (03/13/2023).  Problem list overviews that were updated at today's visit: Problem  Prediabetes  Chronic Kidney Disease, Stage 2 (Mild)      Nephrologist: No care team member to display  Medicines:     ARB/ACEI:  not yet discussed    SGLT2 inhibitor:   will read about jardiance/farxiga  Imaging:  Labwork:  Creatinine, Ser (mg/dL)  Date Value  02/72/5366 1.03  01/05/2022 1.07  01/20/2020 1.02  11/22/2018 1.09  06/11/2018 1.02  06/27/2016 1.11  04/03/2014 1.0  04/13/2010 1.1   GFR (mL/min)  Date Value  03/14/2023 77.44  01/05/2022 74.60  01/20/2020 74.47  11/22/2018 73.61  06/11/2018  79.59  06/27/2016 72.69  04/03/2014 83.63   No results found for: "EGFR"  Lab Results  Component Value Date   NA 139 03/14/2023   K 4.8 03/14/2023   CL 101 03/14/2023   CO2 30 03/14/2023   CALCIUM 9.6 03/14/2023   PROT 7.0 03/14/2023   ALBUMIN 4.4 03/14/2023   HGB 14.4 03/14/2023   HCT 40.4 03/14/2023   PSA 0.42 03/14/2023   HGBA1C 5.9 03/14/2023   Lab Results  Component Value Date   COLORURINE LT. YELLOW 04/13/2010   APPEARANCEUR CLEAR 04/13/2010   LABSPEC 1.010 04/13/2010   PHURINE 7.0 04/13/2010   GLUCOSEU NEGATIVE 04/13/2010   BILIRUBINUR neg 11/22/2018   KETONESUR NEGATIVE 04/13/2010    PROTEINUR Negative 11/22/2018   UROBILINOGEN 0.2 11/22/2018   NITRITE neg 11/22/2018   LEUKOCYTESUR Negative 11/22/2018    Symptoms to Watch For: changes in urination, swelling, fatigue, chest pain, or shortness of breath  Medicines to Avoid:  diuretics (fluid pills), NSAIDs, proton pump inhibitor (PPI) stomach acid reducer, contrasted imaging studies, aminoglycosides, acyclovir, phosphate-based bowel prep, baclofen  Lifestyle and Nutrition:  Limit sodium, potassium, phosphorus and protein intake, consider nutrition consult, smoking avoidance, exercise regularly, manage weight, and reduce stress, control blood sugar, avoid dehydration (may need early emergency room visits for nausea vomiting diarrhea)  Vaccinations: flu/COVID/pneumonia/hepatitis B vaccinations are more important in kidney disease     Leg Cramps (Resolved)   He has difficulty falling asleep without zanaflex    Current Outpatient Medications on File Prior to Visit  Medication Sig   acamprosate (CAMPRAL) 333 MG tablet Take 2 tablets (666 mg total) by mouth 3 (three) times daily with meals.   esomeprazole (NEXIUM) 40 MG capsule Take 40 mg by mouth daily as needed.   famotidine-calcium carbonate-magnesium hydroxide (PEPCID COMPLETE) 10-800-165 MG chewable tablet Chew 1 tablet by mouth daily as needed.   simvastatin (ZOCOR) 40 MG tablet Take 1 tablet (40 mg total) by mouth daily at 6 PM.   tiZANidine (ZANAFLEX) 4 MG tablet Take 1 tablet (4 mg total) by mouth every 6 (six) hours as needed for muscle spasms.   No current facility-administered medications on file prior to visit.  There are no discontinued medications.   Objective   Physical Exam  BP 106/80 (BP Location: Left Arm, Patient Position: Sitting)   Pulse 75   Temp 98.2 F (36.8 C) (Temporal)   Ht 5\' 10"  (1.778 m)   Wt 185 lb 6.4 oz (84.1 kg)   SpO2 97%   BMI 26.60 kg/m  Wt Readings from Last 10 Encounters:  09/27/23 185 lb 6.4 oz (84.1 kg)  04/05/23  184 lb (83.5 kg)  03/13/23 183 lb 12.8 oz (83.4 kg)  02/16/22 186 lb (84.4 kg)  01/07/22 187 lb (84.8 kg)  09/22/20 191 lb 9.6 oz (86.9 kg)  03/11/20 187 lb 6.4 oz (85 kg)  12/24/18 188 lb 6.4 oz (85.5 kg)  11/22/18 195 lb 11.2 oz (88.8 kg)  06/11/18 196 lb 6.4 oz (89.1 kg)   Vital signs reviewed.  Nursing notes reviewed. Weight trend reviewed. Abnormalities and Problem-Specific physical exam findings:  relaxed, well-spoken, health conscious  General Appearance:  No acute distress appreciable.   Well-groomed, healthy-appearing male.  Well proportioned with no abnormal fat distribution.  Good muscle tone. Pulmonary:  Normal work of breathing at rest, no respiratory distress apparent. SpO2: 97 %  Musculoskeletal: All extremities are intact.  Neurological:  Awake, alert, oriented, and engaged.  No obvious focal neurological deficits or cognitive  impairments.  Sensorium seems unclouded.   Speech is clear and coherent with logical content. Psychiatric:  Appropriate mood, pleasant and cooperative demeanor, thoughtful and engaged during the exam  Results   LABS A1c: 5.9% (02/2023) RBC: macrocytosis        No results found for any visits on 09/27/23.  Lab on 03/14/2023  Component Date Value   WBC 03/14/2023 6.4    RBC 03/14/2023 4.00 (L)    Hemoglobin 03/14/2023 14.4    HCT 03/14/2023 40.4    MCV 03/14/2023 101.1 (H)    MCHC 03/14/2023 35.7    RDW 03/14/2023 13.2    Platelets 03/14/2023 449.0 (H)    Neutrophils Relative % 03/14/2023 52.4    Lymphocytes Relative 03/14/2023 29.4    Monocytes Relative 03/14/2023 11.3    Eosinophils Relative 03/14/2023 5.8 (H)    Basophils Relative 03/14/2023 1.1    Neutro Abs 03/14/2023 3.3    Lymphs Abs 03/14/2023 1.9    Monocytes Absolute 03/14/2023 0.7    Eosinophils Absolute 03/14/2023 0.4    Basophils Absolute 03/14/2023 0.1    Hgb A1c MFr Bld 03/14/2023 5.9    PSA 03/14/2023 0.42    TSH 03/14/2023 1.91    Sodium 03/14/2023 139     Potassium 03/14/2023 4.8    Chloride 03/14/2023 101    CO2 03/14/2023 30    Glucose, Bld 03/14/2023 96    BUN 03/14/2023 12    Creatinine, Ser 03/14/2023 1.03    Total Bilirubin 03/14/2023 0.5    Alkaline Phosphatase 03/14/2023 85    AST 03/14/2023 16    ALT 03/14/2023 19    Total Protein 03/14/2023 7.0    Albumin 03/14/2023 4.4    GFR 03/14/2023 77.44    Calcium 03/14/2023 9.6    No image results found.   No results found.  MR LUMBAR SPINE WO CONTRAST  Result Date: 11/08/2022 CLINICAL DATA:  Low back pain radiating to the left lower extremity. EXAM: MRI LUMBAR SPINE WITHOUT CONTRAST TECHNIQUE: Multiplanar, multisequence MR imaging of the lumbar spine was performed. No intravenous contrast was administered. COMPARISON:  None Available. FINDINGS: Segmentation:  Standard. Alignment:  Physiologic. Vertebrae:  No fracture, evidence of discitis, or bone lesion. Conus medullaris and cauda equina: Conus extends to the L1-L2 disc space level. Conus and cauda equina appear normal. Paraspinal and other soft tissues: Negative. Disc levels: T11-T12: Only imaged in the sagittal plane. No spinal canal or neuroforaminal stenosis. T12-L1: Mild bilateral facet degenerative change. No significant disc bulge. No spinal canal stenosis. No neuroforaminal stenosis. L1-L2: Mild bilateral facet degenerative change. No significant disc bulge. No spinal canal stenosis. No neuroforaminal stenosis. L2-L3: Mild bilateral facet degenerative change. No significant disc bulge. No spinal canal stenosis. No neuroforaminal stenosis. L3-L4: Mild bilateral facet degenerative change. No significant disc bulge. No spinal canal stenosis. No neuroforaminal stenosis. L4-L5: Large left paracentral disc extrusion that severely narrows the left lateral recess and moderately narrows the spinal canal. No neuroforaminal stenosis. L5-S1: Moderate bilateral facet degenerative change. No disc bulge. No spinal canal stenosis. No significant  neuroforaminal stenosis. 7 mm extraspinal cyst at the right L5-S1 facet joint. IMPRESSION: Large left paracentral disc extrusion at L4-L5 that severely narrows the left lateral recess and moderately narrows the spinal canal. Electronically Signed   By: Lorenza Cambridge M.D.   On: 11/08/2022 17:55       Additional Info: This encounter employed real-time, collaborative documentation. The patient actively reviewed and updated their medical record on a shared screen, ensuring transparency  and facilitating joint problem-solving for the problem list, overview, and plan. This approach promotes accurate, informed care. The treatment plan was discussed and reviewed in detail, including medication safety, potential side effects, and all patient questions. We confirmed understanding and comfort with the plan. Follow-up instructions were established, including contacting the office for any concerns, returning if symptoms worsen, persist, or new symptoms develop, and precautions for potential emergency department visits.

## 2023-09-27 NOTE — Assessment & Plan Note (Signed)
His A1C level was 5.9% in March, indicating prediabetes, likely related to alcohol use. We will recheck the A1C and have discussed lifestyle modifications, including reducing alcohol intake.

## 2023-10-02 ENCOUNTER — Other Ambulatory Visit: Payer: 59

## 2023-10-04 ENCOUNTER — Other Ambulatory Visit: Payer: 59

## 2023-10-05 ENCOUNTER — Other Ambulatory Visit: Payer: 59

## 2023-10-10 ENCOUNTER — Other Ambulatory Visit: Payer: 59

## 2023-11-22 ENCOUNTER — Ambulatory Visit: Payer: 59 | Admitting: Emergency Medicine

## 2023-11-22 ENCOUNTER — Encounter: Payer: Self-pay | Admitting: Emergency Medicine

## 2023-11-22 VITALS — BP 128/80 | HR 82 | Temp 98.5°F | Ht 70.0 in | Wt 188.8 lb

## 2023-11-22 DIAGNOSIS — H00012 Hordeolum externum right lower eyelid: Secondary | ICD-10-CM

## 2023-11-22 MED ORDER — ERYTHROMYCIN 5 MG/GM OP OINT: 1.0000 | TOPICAL_OINTMENT | Freq: Three times a day (TID) | OPHTHALMIC | 0 refills | Status: AC

## 2023-11-22 NOTE — Progress Notes (Signed)
Kyle Palmer 62 y.o.   Chief Complaint  Patient presents with   Stye    Patient states he thinks its a stye and is leaving out of town tomorrow and wants to make sure it isn't . Started yesterday morning, itching redness, and some swelling     HISTORY OF PRESENT ILLNESS: This is a 62 y.o. male complaining of possible stye to the right eye. Denies visual problems.  HPI   Prior to Admission medications   Medication Sig Start Date End Date Taking? Authorizing Provider  acamprosate (CAMPRAL) 333 MG tablet Take 2 tablets (666 mg total) by mouth 3 (three) times daily with meals. 03/13/23  Yes Lula Olszewski, MD  esomeprazole (NEXIUM) 40 MG capsule Take 40 mg by mouth daily as needed. 08/12/11  Yes Swords, Valetta Mole, MD  famotidine-calcium carbonate-magnesium hydroxide (PEPCID COMPLETE) 10-800-165 MG chewable tablet Chew 1 tablet by mouth daily as needed. 03/13/23  Yes Lula Olszewski, MD  simvastatin (ZOCOR) 40 MG tablet Take 1 tablet (40 mg total) by mouth daily at 6 PM. 04/06/23  Yes Lula Olszewski, MD  tiZANidine (ZANAFLEX) 4 MG tablet Take 1 tablet (4 mg total) by mouth every 6 (six) hours as needed for muscle spasms. 03/13/23  Yes Lula Olszewski, MD    No Known Allergies  Patient Active Problem List   Diagnosis Date Noted   Prediabetes 09/27/2023   Chronic kidney disease, stage 2 (mild) 03/14/2023   Work-related stress 03/13/2023   Alcohol use 03/13/2023   Macrocytosis without anemia 03/13/2023   Financial insecurity due to debt 03/13/2023   Previous back surgery 12/08/2022   Squamous cell carcinoma in situ (SCCIS) of skin of forehead 03/11/2020   History of adenomatous polyp of colon 03/03/2019   Family history of colon cancer in mother 03/03/2019   Benign essential hypertension 06/11/2018   BPH (benign prostatic hyperplasia) 06/20/2016   GERD 11/27/2008   Hyperlipidemia 07/24/2007    Past Medical History:  Diagnosis Date   GERD (gastroesophageal reflux disease)     Hyperlipidemia    Leg cramps 03/13/2023   He has difficulty falling asleep without zanaflex     Squamous cell carcinoma of skin 03/06/2020   in situ on right forehead - CX3+5FU   Work-related stress 03/13/2023   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  Social History   Socioeconomic History   Marital status: Married    Spouse name: Not on file   Number of children: Not on file   Years of education: Not on file   Highest education level: 12th grade  Occupational History   Occupation: Psychologist, sport and exercise  Tobacco Use   Smoking status: Former   Smokeless tobacco: Never  Advertising account planner   Vaping status: Never Used  Substance and Sexual Activity   Alcohol use: Yes   Drug use: No   Sexual activity: Not on file  Other Topics Concern   Not on file  Social History Narrative   Not on file   Social Determinants of Health   Financial Resource Strain: Low Risk  (09/25/2023)   Overall Financial Resource Strain (CARDIA)    Difficulty of Paying Living Expenses: Not hard at all  Food Insecurity: No Food Insecurity (09/25/2023)   Hunger Vital Sign    Worried About Running Out of Food in the Last Year: Never true    Ran Out of Food in the Last Year: Never true  Transportation Needs: No Transportation Needs (09/25/2023)   PRAPARE - Administrator, Civil Service (Medical): No    Lack of Transportation (Non-Medical): No  Physical Activity: Insufficiently Active (09/25/2023)   Exercise Vital Sign    Days of Exercise per Week: 1 day    Minutes of Exercise per Session: 20 min  Stress: Stress Concern Present (09/25/2023)   [employer] of Occupational Health - Occupational Stress Questionnaire    Feeling of Stress : Rather much  Social Connections: Moderately Integrated (09/25/2023)   Social Connection and Isolation Panel [NHANES]    Frequency of Communication with Friends and Family: Three times a week    Frequency of  Social Gatherings with Friends and Family: Once a week    Attends Religious Services: 1 to 4 times per year    Active Member of [employer] or Organizations: No    Attends Engineer, structural: Not on file    Marital Status: Married  Catering manager Violence: Not on file    Family History  Problem Relation Age of Onset   Colon cancer Mother 52       history on NMSC   Hypertension Father    High Cholesterol Father    Other Father 19       pancreatic tumor; not cancer   Colon cancer Maternal Grandmother 52   Lung cancer Maternal Grandfather 30   Pancreatic cancer Paternal Grandfather 25 - 69     Review of Systems  Constitutional: Negative.  Negative for chills and fever.  HENT: Negative.  Negative for congestion and sore throat.   Respiratory: Negative.  Negative for cough and shortness of breath.   Cardiovascular: Negative.  Negative for chest pain and palpitations.  Gastrointestinal:  Negative for abdominal pain, nausea and vomiting.  Skin: Negative.  Negative for rash.  Neurological: Negative.  Negative for dizziness and headaches.  All other systems reviewed and are negative.   Vitals:   11/22/23 1515  BP: 128/80  Pulse: 82  Temp: 98.5 F (36.9 C)  SpO2: 99%    Physical Exam Vitals reviewed.  Constitutional:      Appearance: Normal appearance.  HENT:     Head: Normocephalic.  Eyes:     Extraocular Movements: Extraocular movements intact.     Pupils: Pupils are equal, round, and reactive to light.     Comments: Positive stye lower eyelid nasal aspect  Cardiovascular:     Rate and Rhythm: Normal rate.  Pulmonary:     Effort: Pulmonary effort is normal.  Skin:    General: Skin is warm and dry.  Neurological:     Mental Status: He is alert and oriented to person, place, and time.  Psychiatric:        Mood and Affect: Mood normal.        Behavior: Behavior normal.      ASSESSMENT & PLAN: Problem List Items Addressed This Visit       Other    Hordeolum externum of right lower eyelid - Primary    Recommend warm compresses several times a day for the next couple days Topical antibiotic with erythromycin ointment recommended 3 times a day for 5 days Advised to contact the office if no better or worse during the next several days.      Relevant Medications   erythromycin ophthalmic ointment   Patient Instructions  Stye A stye, also known as a hordeolum, is a bump that forms on an eyelid. It may look like  a pimple next to the eyelash. A stye can form inside the eyelid (internal stye) or outside the eyelid (external stye). A stye can cause redness, swelling, and pain on the eyelid. Styes are very common. Anyone can get them at any age. They usually occur in just one eye at a time, but you may have more than one in either eye. What are the causes? A stye is caused by an infection. The infection is almost always caused by bacteria called Staphylococcus aureus. This is a common type of bacteria that lives on the skin. An internal stye may result from an infected oil-producing gland inside the eyelid. An external stye may be caused by an infection at the base of the eyelash (hair follicle). What increases the risk? You are more likely to develop a stye if: You have had a stye before. You have any of these conditions: Red, itchy, inflamed eyelids (blepharitis). A skin condition such as seborrheic dermatitis or rosacea. High fat levels in your blood (lipids). Dry eyes. What are the signs or symptoms? The most common symptom of a stye is eyelid pain. Internal styes are more painful than external styes. Other symptoms may include: Painful swelling of your eyelid. A scratchy feeling in your eye. Tearing and redness of your eye. A pimple-like bump on the edge of the eyelid. Pus draining from the stye. How is this diagnosed? Your health care provider may be able to diagnose a stye just by examining your eye. The health care provider may  also check to make sure: You do not have a fever or other signs of a more serious infection. The infection has not spread to other parts of your eye or areas around your eye. How is this treated? Most styes will clear up in a few days without treatment or with warm compresses applied to the area. You may need to use antibiotic drops or ointment to treat an infection. Sometimes, steroid drops or ointment are used in addition to antibiotics. In some cases, your health care provider may give you a small steroid injection in the eyelid. If your stye does not heal with routine treatment, your health care provider may drain pus from the stye using a thin blade or needle. This may be done if the stye is large, causing a lot of pain, or affecting your vision. Follow these instructions at home: Take over-the-counter and prescription medicines only as told by your health care provider. This includes eye drops or ointments. If you were prescribed an antibiotic medicine, steroid medicine, or both, apply or use them as told by your health care provider. Do not stop using the medicine even if your condition improves. Apply a warm, wet cloth (warm compress) to your eye for 5-10 minutes, 4 to 6 times a day. Clean the affected eyelid as directed by your health care provider. Do not wear contact lenses or eye makeup until your stye has healed and your health care provider says that it is safe. Do not try to pop or drain the stye. Do not rub your eye. Contact a health care provider if: You have chills or a fever. Your stye does not go away after several days. Your stye affects your vision. Your eyeball becomes swollen, red, or painful. Get help right away if: You have pain when moving your eye around. Summary A stye is a bump that forms on an eyelid. It may look like a pimple next to the eyelash. A stye can form inside the eyelid (  internal stye) or outside the eyelid (external stye). A stye can cause redness,  swelling, and pain on the eyelid. Your health care provider may be able to diagnose a stye just by examining your eye. Apply a warm, wet cloth (warm compress) to your eye for 5-10 minutes, 4 to 6 times a day. This information is not intended to replace advice given to you by your health care provider. Make sure you discuss any questions you have with your health care provider. Document Revised: 02/10/2021 Document Reviewed: 02/10/2021 Elsevier Patient Education  2024 Elsevier Inc.     Edwina Barth, MD [facility]

## 2023-11-22 NOTE — Assessment & Plan Note (Signed)
Recommend warm compresses several times a day for the next couple days Topical antibiotic with erythromycin ointment recommended 3 times a day for 5 days Advised to contact the office if no better or worse during the next several days.

## 2023-11-22 NOTE — Patient Instructions (Signed)
Stye A stye, also known as a hordeolum, is a bump that forms on an eyelid. It may look like a pimple next to the eyelash. A stye can form inside the eyelid (internal stye) or outside the eyelid (external stye). A stye can cause redness, swelling, and pain on the eyelid. Styes are very common. Anyone can get them at any age. They usually occur in just one eye at a time, but you may have more than one in either eye. What are the causes? A stye is caused by an infection. The infection is almost always caused by bacteria called Staphylococcus aureus. This is a common type of bacteria that lives on the skin. An internal stye may result from an infected oil-producing gland inside the eyelid. An external stye may be caused by an infection at the base of the eyelash (hair follicle). What increases the risk? You are more likely to develop a stye if: You have had a stye before. You have any of these conditions: Red, itchy, inflamed eyelids (blepharitis). A skin condition such as seborrheic dermatitis or rosacea. High fat levels in your blood (lipids). Dry eyes. What are the signs or symptoms? The most common symptom of a stye is eyelid pain. Internal styes are more painful than external styes. Other symptoms may include: Painful swelling of your eyelid. A scratchy feeling in your eye. Tearing and redness of your eye. A pimple-like bump on the edge of the eyelid. Pus draining from the stye. How is this diagnosed? Your health care provider may be able to diagnose a stye just by examining your eye. The health care provider may also check to make sure: You do not have a fever or other signs of a more serious infection. The infection has not spread to other parts of your eye or areas around your eye. How is this treated? Most styes will clear up in a few days without treatment or with warm compresses applied to the area. You may need to use antibiotic drops or ointment to treat an infection. Sometimes,  steroid drops or ointment are used in addition to antibiotics. In some cases, your health care provider may give you a small steroid injection in the eyelid. If your stye does not heal with routine treatment, your health care provider may drain pus from the stye using a thin blade or needle. This may be done if the stye is large, causing a lot of pain, or affecting your vision. Follow these instructions at home: Take over-the-counter and prescription medicines only as told by your health care provider. This includes eye drops or ointments. If you were prescribed an antibiotic medicine, steroid medicine, or both, apply or use them as told by your health care provider. Do not stop using the medicine even if your condition improves. Apply a warm, wet cloth (warm compress) to your eye for 5-10 minutes, 4 to 6 times a day. Clean the affected eyelid as directed by your health care provider. Do not wear contact lenses or eye makeup until your stye has healed and your health care provider says that it is safe. Do not try to pop or drain the stye. Do not rub your eye. Contact a health care provider if: You have chills or a fever. Your stye does not go away after several days. Your stye affects your vision. Your eyeball becomes swollen, red, or painful. Get help right away if: You have pain when moving your eye around. Summary A stye is a bump that forms   on an eyelid. It may look like a pimple next to the eyelash. A stye can form inside the eyelid (internal stye) or outside the eyelid (external stye). A stye can cause redness, swelling, and pain on the eyelid. Your health care provider may be able to diagnose a stye just by examining your eye. Apply a warm, wet cloth (warm compress) to your eye for 5-10 minutes, 4 to 6 times a day. This information is not intended to replace advice given to you by your health care provider. Make sure you discuss any questions you have with your health care  provider. Document Revised: 02/10/2021 Document Reviewed: 02/10/2021 Elsevier Patient Education  2024 Elsevier Inc.  

## 2024-05-04 ENCOUNTER — Other Ambulatory Visit: Payer: Self-pay | Admitting: Internal Medicine

## 2024-05-04 DIAGNOSIS — E785 Hyperlipidemia, unspecified: Secondary | ICD-10-CM
